# Patient Record
Sex: Female | Born: 1950 | Race: White | Hispanic: No | Marital: Single | Smoking: Former smoker
Health system: Southern US, Community
[De-identification: ages and names within clinical notes are randomized; demographics above are authoritative.]

## PROBLEM LIST (undated history)

## (undated) DIAGNOSIS — Z9289 Personal history of other medical treatment: Secondary | ICD-10-CM

## (undated) DIAGNOSIS — I639 Cerebral infarction, unspecified: Secondary | ICD-10-CM

## (undated) DIAGNOSIS — C801 Malignant (primary) neoplasm, unspecified: Secondary | ICD-10-CM

## (undated) HISTORY — PX: RENAL BIOPSY: SHX156

---

## 2018-04-17 DIAGNOSIS — C541 Malignant neoplasm of endometrium: Secondary | ICD-10-CM

## 2018-04-17 HISTORY — DX: Malignant neoplasm of endometrium: C54.1

## 2020-12-31 DIAGNOSIS — D631 Anemia in chronic kidney disease: Secondary | ICD-10-CM | POA: Diagnosis present

## 2020-12-31 DIAGNOSIS — N186 End stage renal disease: Secondary | ICD-10-CM | POA: Diagnosis present

## 2021-01-16 DIAGNOSIS — I10 Essential (primary) hypertension: Secondary | ICD-10-CM | POA: Insufficient documentation

## 2021-03-04 ENCOUNTER — Encounter (HOSPITAL_COMMUNITY): Payer: Self-pay | Admitting: *Deleted

## 2021-03-04 ENCOUNTER — Emergency Department (HOSPITAL_COMMUNITY)
Admission: EM | Admit: 2021-03-04 | Discharge: 2021-03-04 | Disposition: A | Discharge status: Home / Self Care | Payer: Medicare Other | Attending: Emergency Medicine | Admitting: Emergency Medicine

## 2021-03-04 ENCOUNTER — Emergency Department (HOSPITAL_COMMUNITY): Payer: Medicare Other

## 2021-03-04 ENCOUNTER — Other Ambulatory Visit: Payer: Self-pay

## 2021-03-04 DIAGNOSIS — Z859 Personal history of malignant neoplasm, unspecified: Secondary | ICD-10-CM | POA: Diagnosis not present

## 2021-03-04 DIAGNOSIS — Z992 Dependence on renal dialysis: Secondary | ICD-10-CM | POA: Insufficient documentation

## 2021-03-04 DIAGNOSIS — N189 Chronic kidney disease, unspecified: Secondary | ICD-10-CM

## 2021-03-04 DIAGNOSIS — N186 End stage renal disease: Secondary | ICD-10-CM | POA: Insufficient documentation

## 2021-03-04 DIAGNOSIS — R0602 Shortness of breath: Secondary | ICD-10-CM | POA: Diagnosis present

## 2021-03-04 HISTORY — DX: Personal history of other medical treatment: Z92.89

## 2021-03-04 HISTORY — DX: Cerebral infarction, unspecified: I63.9

## 2021-03-04 HISTORY — DX: Malignant (primary) neoplasm, unspecified: C80.1

## 2021-03-04 LAB — BASIC METABOLIC PANEL
Anion gap: 17 — ABNORMAL HIGH (ref 5–15)
BUN: 64 mg/dL — ABNORMAL HIGH (ref 8–23)
CO2: 24 mmol/L (ref 22–32)
Calcium: 7.8 mg/dL — ABNORMAL LOW (ref 8.9–10.3)
Chloride: 98 mmol/L (ref 98–111)
Creatinine, Ser: 8.66 mg/dL — ABNORMAL HIGH (ref 0.44–1.00)
GFR, Estimated: 4 mL/min — ABNORMAL LOW (ref >60)
Glucose, Bld: 90 mg/dL (ref 70–99)
Potassium: 4.2 mmol/L (ref 3.5–5.1)
Sodium: 139 mmol/L (ref 135–145)

## 2021-03-04 LAB — I-STAT CHEM 8, ED
BUN: 62 mg/dL — ABNORMAL HIGH (ref 8–23)
BUN: 63 mg/dL — ABNORMAL HIGH (ref 8–23)
Calcium, Ion: 0.88 mmol/L — CL (ref 1.15–1.40)
Calcium, Ion: 0.89 mmol/L — CL (ref 1.15–1.40)
Chloride: 99 mmol/L (ref 98–111)
Chloride: 99 mmol/L (ref 98–111)
Creatinine, Ser: 9.2 mg/dL — ABNORMAL HIGH (ref 0.44–1.00)
Creatinine, Ser: 9.3 mg/dL — ABNORMAL HIGH (ref 0.44–1.00)
Glucose, Bld: 94 mg/dL (ref 70–99)
Glucose, Bld: 95 mg/dL (ref 70–99)
HCT: 25 % — ABNORMAL LOW (ref 36.0–46.0)
HCT: 26 % — ABNORMAL LOW (ref 36.0–46.0)
Hemoglobin: 8.5 g/dL — ABNORMAL LOW (ref 12.0–15.0)
Hemoglobin: 8.8 g/dL — ABNORMAL LOW (ref 12.0–15.0)
Potassium: 4.1 mmol/L (ref 3.5–5.1)
Potassium: 4.1 mmol/L (ref 3.5–5.1)
Sodium: 138 mmol/L (ref 135–145)
Sodium: 139 mmol/L (ref 135–145)
TCO2: 25 mmol/L (ref 22–32)
TCO2: 25 mmol/L (ref 22–32)

## 2021-03-04 LAB — CBC WITH DIFFERENTIAL/PLATELET
Abs Immature Granulocytes: 0.01 10*3/uL (ref 0.00–0.07)
Basophils Absolute: 0 10*3/uL (ref 0.0–0.1)
Basophils Relative: 1 %
Eosinophils Absolute: 0.1 10*3/uL (ref 0.0–0.5)
Eosinophils Relative: 2 %
HCT: 26.9 % — ABNORMAL LOW (ref 36.0–46.0)
Hemoglobin: 8.4 g/dL — ABNORMAL LOW (ref 12.0–15.0)
Immature Granulocytes: 0 %
Lymphocytes Relative: 25 %
Lymphs Abs: 1.4 10*3/uL (ref 0.7–4.0)
MCH: 29.1 pg (ref 26.0–34.0)
MCHC: 31.2 g/dL (ref 30.0–36.0)
MCV: 93.1 fL (ref 80.0–100.0)
Monocytes Absolute: 0.4 10*3/uL (ref 0.1–1.0)
Monocytes Relative: 7 %
Neutro Abs: 3.5 10*3/uL (ref 1.7–7.7)
Neutrophils Relative %: 65 %
Platelets: 187 10*3/uL (ref 150–400)
RBC: 2.89 MIL/uL — ABNORMAL LOW (ref 3.87–5.11)
RDW: 16.1 % — ABNORMAL HIGH (ref 11.5–15.5)
WBC: 5.4 10*3/uL (ref 4.0–10.5)
nRBC: 0 % (ref 0.0–0.2)

## 2021-03-04 LAB — BRAIN NATRIURETIC PEPTIDE: B Natriuretic Peptide: 4485.9 pg/mL — ABNORMAL HIGH (ref 0.0–100.0)

## 2021-03-04 MED ORDER — CALCIUM CARBONATE ANTACID 500 MG PO CHEW
1.0000 | CHEWABLE_TABLET | Freq: Once | ORAL | Status: AC
  Administered 2021-03-04: 200 mg via ORAL
  Filled 2021-03-04: qty 1

## 2021-03-04 NOTE — ED Provider Notes (Signed)
MSE was initiated and I personally evaluated the patient and placed orders (if any) at  2:51 PM on March 04, 2021.  The patient appears stable so that the remainder of the MSE may be completed by another provider.  She is presenting by ambulance.  Recently moved here from [place] and has missed 2 dialysis sessions.  Complaining of shortness of breath.  Ordered labs EKG chest x-ray   Hayden Rasmussen, MD 03/04/21 2032

## 2021-03-04 NOTE — ED Provider Notes (Signed)
[facility] EMERGENCY DEPARTMENT Provider Note   CSN: [id number] Arrival date & time: 03/04/21  1446     History Chief Complaint  Patient presents with   Shortness of Breath    Sonya Beck is a 70 y.o. female.  Patient presents ER chief complaint of shortness of breath, generalized malaise.  Patient states that she is a dialysis dependent patient.  Typically gets dialysis Monday Wednesday Friday.  Last episode dialysis was on Friday, subsequently moved down to [place] from [place] on Saturday.  She had called dialysis centers previously and had arranged for dialysis yesterday but they called to the last minute and told her that they were not ready to take her yet.  They were unable to take her today as well, but told that they would be able to dialyze her tomorrow morning at 10 AM.  Patient felt this was too long and came into the ER for evaluation.  She denies any fevers or cough or vomiting or diarrhea.  Denies headache or chest pain.      Past Medical History:  Diagnosis Date   Cancer [facility])    History of blood transfusion    Stroke ([place])     There are no problems to display for this patient.   Past Surgical History:  Procedure Laterality Date   RENAL BIOPSY       OB History   No obstetric history on file.     No family history on file.     Home Medications Prior to Admission medications   Not on File    Allergies    Heparin and Penicillins  Review of Systems   Review of Systems  Constitutional:  Negative for fever.  HENT:  Negative for ear pain.   Eyes:  Negative for pain.  Respiratory:  Negative for cough.   Cardiovascular:  Negative for chest pain.  Gastrointestinal:  Negative for vomiting.  Genitourinary:  Negative for flank pain.  Musculoskeletal:  Negative for back pain.  Skin:  Negative for rash.  Neurological:  Negative for headaches.   Physical Exam Updated Vital Signs BP (!) 114/57   Pulse 72   Temp  98.4 F (36.9 C) (Oral)   Resp (!) 24   Ht 5\' 2"  (1.575 m)   Wt 81.6 kg   SpO2 95%   BMI 32.92 kg/m   Physical Exam Constitutional:      General: She is not in acute distress.    Appearance: Normal appearance.  HENT:     Head: Normocephalic.     Nose: Nose normal.  Eyes:     Extraocular Movements: Extraocular movements intact.  Cardiovascular:     Rate and Rhythm: Normal rate.  Pulmonary:     Effort: Pulmonary effort is normal.  Abdominal:     Tenderness: There is no abdominal tenderness. There is no guarding or rebound.  Musculoskeletal:        General: Normal range of motion.     Cervical back: Normal range of motion.  Neurological:     General: No focal deficit present.     Mental Status: She is alert. Mental status is at baseline.    ED Results / Procedures / Treatments   Labs (all labs ordered are listed, but only abnormal results are displayed) Labs Reviewed  BASIC METABOLIC PANEL - Abnormal; Notable for the following components:      Result Value   BUN 64 (*)    Creatinine, Ser 8.66 (*)  Calcium 7.8 (*)    GFR, Estimated 4 (*)    Anion gap 17 (*)    All other components within normal limits  CBC WITH DIFFERENTIAL/PLATELET - Abnormal; Notable for the following components:   RBC 2.89 (*)    Hemoglobin 8.4 (*)    HCT 26.9 (*)    RDW 16.1 (*)    All other components within normal limits  I-STAT CHEM 8, ED - Abnormal; Notable for the following components:   BUN 62 (*)    Creatinine, Ser 9.20 (*)    Calcium, Ion 0.88 (*)    Hemoglobin 8.5 (*)    HCT 25.0 (*)    All other components within normal limits  I-STAT CHEM 8, ED - Abnormal; Notable for the following components:   BUN 63 (*)    Creatinine, Ser 9.30 (*)    Calcium, Ion 0.89 (*)    Hemoglobin 8.8 (*)    HCT 26.0 (*)    All other components within normal limits  BRAIN NATRIURETIC PEPTIDE    EKG EKG Interpretation  Date/Time:  Wednesday March 04 2021 14:52:34 EDT Ventricular Rate:  77 PR  Interval:  160 QRS Duration: 103 QT Interval:  429 QTC Calculation: 486 R Axis:   81 Text Interpretation: Sinus rhythm Borderline right axis deviation Borderline prolonged QT interval No old tracing to compare Confirmed by Aletta Edouard [phone]) on 03/04/2021 2:53:59 PM  Radiology DG Chest Port 1 View  Result Date: 03/04/2021 CLINICAL DATA:  70 year old female with shortness of breath. EXAM: PORTABLE CHEST 1 VIEW COMPARISON:  None. FINDINGS: There is mild cardiomegaly with mild central vascular congestion. No focal consolidation, pleural effusion, or pneumothorax. Atherosclerotic calcification of the aortic arch. No acute osseous pathology. IMPRESSION: Mild cardiomegaly with mild central vascular congestion. No focal consolidation. Electronically Signed   By: Anner Crete M.D.   On: 03/04/2021 15:21    Procedures Procedures   Medications Ordered in ED Medications  calcium carbonate (TUMS - dosed in mg elemental calcium) chewable tablet 200 mg of elemental calcium (has no administration in time range)    ED Course  I have reviewed the triage vital signs and the nursing notes.  Pertinent labs & imaging results that were available during my care of the patient were reviewed by me and considered in my medical decision making (see chart for details).    MDM Rules/Calculators/A&P                          Vital signs within normal limits patient is 97 to 98% on room air which is appropriate.  Rest.  Brought 2025 which is normal as well.  Chest x-ray shows cardiomegaly but no significant pulmonary edema noted.  Labs otherwise consistent with history of end-stage renal disease normal potassium noted.  Calcium was low and repleted here in the ER.  She states that dialysis will be able to take her tomorrow morning at 10, I feel this is appropriate for her given her vital signs and labs here today.  Recommending she complete her dialysis tomorrow morning as scheduled.  Advised immediate  return if she has pain worsening breathing or any additional concerns.  Final Clinical Impression(s) / ED Diagnoses Final diagnoses:  Chronic kidney disease, unspecified CKD stage    Rx / DC Orders ED Discharge Orders     None        Luna Fuse, MD 03/04/21 1758

## 2021-03-04 NOTE — Discharge Instructions (Addendum)
Call your primary care doctor or specialist as discussed in the next 2-3 days.   Return immediately back to the ER if:  Your symptoms worsen within the next 12-24 hours. You develop new symptoms such as new fevers, persistent vomiting, new pain, shortness of breath, or new weakness or numbness, or if you have any other concerns.  

## 2021-03-18 ENCOUNTER — Encounter (HOSPITAL_COMMUNITY): Payer: Self-pay

## 2021-03-18 ENCOUNTER — Inpatient Hospital Stay (HOSPITAL_COMMUNITY)
Admission: EM | Admit: 2021-03-18 | Discharge: 2021-03-21 | DRG: 291 | Disposition: A | Discharge status: Home Health | Payer: Medicare Other | Attending: Internal Medicine | Admitting: Internal Medicine

## 2021-03-18 ENCOUNTER — Other Ambulatory Visit: Payer: Self-pay

## 2021-03-18 ENCOUNTER — Encounter: Payer: Self-pay | Admitting: Nurse Practitioner

## 2021-03-18 ENCOUNTER — Ambulatory Visit (INDEPENDENT_AMBULATORY_CARE_PROVIDER_SITE_OTHER): Payer: Medicare Other | Admitting: Nurse Practitioner

## 2021-03-18 ENCOUNTER — Emergency Department (HOSPITAL_COMMUNITY): Payer: Medicare Other

## 2021-03-18 ENCOUNTER — Other Ambulatory Visit: Payer: Self-pay | Admitting: Nurse Practitioner

## 2021-03-18 VITALS — BP 105/70 | HR 78 | Temp 98.1°F | Ht 62.0 in | Wt 180.0 lb

## 2021-03-18 DIAGNOSIS — C787 Secondary malignant neoplasm of liver and intrahepatic bile duct: Secondary | ICD-10-CM | POA: Diagnosis present

## 2021-03-18 DIAGNOSIS — C649 Malignant neoplasm of unspecified kidney, except renal pelvis: Secondary | ICD-10-CM

## 2021-03-18 DIAGNOSIS — Z6835 Body mass index (BMI) 35.0-35.9, adult: Secondary | ICD-10-CM

## 2021-03-18 DIAGNOSIS — Z20822 Contact with and (suspected) exposure to covid-19: Secondary | ICD-10-CM | POA: Diagnosis present

## 2021-03-18 DIAGNOSIS — Z992 Dependence on renal dialysis: Secondary | ICD-10-CM | POA: Diagnosis not present

## 2021-03-18 DIAGNOSIS — I350 Nonrheumatic aortic (valve) stenosis: Secondary | ICD-10-CM | POA: Diagnosis not present

## 2021-03-18 DIAGNOSIS — Z87891 Personal history of nicotine dependence: Secondary | ICD-10-CM | POA: Diagnosis not present

## 2021-03-18 DIAGNOSIS — Z86718 Personal history of other venous thrombosis and embolism: Secondary | ICD-10-CM

## 2021-03-18 DIAGNOSIS — Z7689 Persons encountering health services in other specified circumstances: Secondary | ICD-10-CM

## 2021-03-18 DIAGNOSIS — D631 Anemia in chronic kidney disease: Secondary | ICD-10-CM | POA: Diagnosis present

## 2021-03-18 DIAGNOSIS — I5033 Acute on chronic diastolic (congestive) heart failure: Secondary | ICD-10-CM | POA: Diagnosis not present

## 2021-03-18 DIAGNOSIS — Z515 Encounter for palliative care: Secondary | ICD-10-CM | POA: Insufficient documentation

## 2021-03-18 DIAGNOSIS — I083 Combined rheumatic disorders of mitral, aortic and tricuspid valves: Secondary | ICD-10-CM | POA: Diagnosis present

## 2021-03-18 DIAGNOSIS — Z7901 Long term (current) use of anticoagulants: Secondary | ICD-10-CM | POA: Diagnosis not present

## 2021-03-18 DIAGNOSIS — Z880 Allergy status to penicillin: Secondary | ICD-10-CM | POA: Diagnosis not present

## 2021-03-18 DIAGNOSIS — Z888 Allergy status to other drugs, medicaments and biological substances status: Secondary | ICD-10-CM | POA: Diagnosis not present

## 2021-03-18 DIAGNOSIS — M797 Fibromyalgia: Secondary | ICD-10-CM | POA: Diagnosis present

## 2021-03-18 DIAGNOSIS — E785 Hyperlipidemia, unspecified: Secondary | ICD-10-CM | POA: Diagnosis present

## 2021-03-18 DIAGNOSIS — Z8673 Personal history of transient ischemic attack (TIA), and cerebral infarction without residual deficits: Secondary | ICD-10-CM | POA: Diagnosis not present

## 2021-03-18 DIAGNOSIS — I1310 Hypertensive heart and chronic kidney disease without heart failure, with stage 1 through stage 4 chronic kidney disease, or unspecified chronic kidney disease: Secondary | ICD-10-CM | POA: Insufficient documentation

## 2021-03-18 DIAGNOSIS — N186 End stage renal disease: Secondary | ICD-10-CM

## 2021-03-18 DIAGNOSIS — R011 Cardiac murmur, unspecified: Secondary | ICD-10-CM

## 2021-03-18 DIAGNOSIS — Z8542 Personal history of malignant neoplasm of other parts of uterus: Secondary | ICD-10-CM | POA: Diagnosis not present

## 2021-03-18 DIAGNOSIS — I132 Hypertensive heart and chronic kidney disease with heart failure and with stage 5 chronic kidney disease, or end stage renal disease: Secondary | ICD-10-CM | POA: Diagnosis not present

## 2021-03-18 DIAGNOSIS — K219 Gastro-esophageal reflux disease without esophagitis: Secondary | ICD-10-CM | POA: Diagnosis present

## 2021-03-18 DIAGNOSIS — I313 Pericardial effusion (noninflammatory): Secondary | ICD-10-CM | POA: Diagnosis present

## 2021-03-18 DIAGNOSIS — I120 Hypertensive chronic kidney disease with stage 5 chronic kidney disease or end stage renal disease: Secondary | ICD-10-CM

## 2021-03-18 DIAGNOSIS — Z905 Acquired absence of kidney: Secondary | ICD-10-CM

## 2021-03-18 DIAGNOSIS — E8770 Fluid overload, unspecified: Secondary | ICD-10-CM | POA: Diagnosis present

## 2021-03-18 DIAGNOSIS — Z9115 Patient's noncompliance with renal dialysis: Secondary | ICD-10-CM | POA: Diagnosis not present

## 2021-03-18 DIAGNOSIS — Z79899 Other long term (current) drug therapy: Secondary | ICD-10-CM

## 2021-03-18 DIAGNOSIS — E669 Obesity, unspecified: Secondary | ICD-10-CM | POA: Diagnosis present

## 2021-03-18 HISTORY — DX: Personal history of transient ischemic attack (TIA), and cerebral infarction without residual deficits: Z86.73

## 2021-03-18 HISTORY — DX: Gastro-esophageal reflux disease without esophagitis: K21.9

## 2021-03-18 HISTORY — DX: Personal history of other venous thrombosis and embolism: Z86.718

## 2021-03-18 HISTORY — DX: Malignant neoplasm of unspecified kidney, except renal pelvis: C64.9

## 2021-03-18 HISTORY — DX: Nonrheumatic aortic (valve) stenosis: I35.0

## 2021-03-18 HISTORY — DX: Fibromyalgia: M79.7

## 2021-03-18 LAB — CBC WITH DIFFERENTIAL/PLATELET
Abs Immature Granulocytes: 0.02 10*3/uL (ref 0.00–0.07)
Basophils Absolute: 0 10*3/uL (ref 0.0–0.1)
Basophils Relative: 1 %
Eosinophils Absolute: 0.1 10*3/uL (ref 0.0–0.5)
Eosinophils Relative: 2 %
HCT: 27 % — ABNORMAL LOW (ref 36.0–46.0)
Hemoglobin: 8 g/dL — ABNORMAL LOW (ref 12.0–15.0)
Immature Granulocytes: 0 %
Lymphocytes Relative: 24 %
Lymphs Abs: 1.3 10*3/uL (ref 0.7–4.0)
MCH: 28.9 pg (ref 26.0–34.0)
MCHC: 29.6 g/dL — ABNORMAL LOW (ref 30.0–36.0)
MCV: 97.5 fL (ref 80.0–100.0)
Monocytes Absolute: 0.3 10*3/uL (ref 0.1–1.0)
Monocytes Relative: 6 %
Neutro Abs: 3.7 10*3/uL (ref 1.7–7.7)
Neutrophils Relative %: 67 %
Platelets: 216 10*3/uL (ref 150–400)
RBC: 2.77 MIL/uL — ABNORMAL LOW (ref 3.87–5.11)
RDW: 16.4 % — ABNORMAL HIGH (ref 11.5–15.5)
WBC: 5.5 10*3/uL (ref 4.0–10.5)
nRBC: 0 % (ref 0.0–0.2)

## 2021-03-18 LAB — COMPREHENSIVE METABOLIC PANEL
ALT: 12 U/L (ref 0–44)
AST: 14 U/L — ABNORMAL LOW (ref 15–41)
Albumin: 3.3 g/dL — ABNORMAL LOW (ref 3.5–5.0)
Alkaline Phosphatase: 53 U/L (ref 38–126)
Anion gap: 14 (ref 5–15)
BUN: 39 mg/dL — ABNORMAL HIGH (ref 8–23)
CO2: 27 mmol/L (ref 22–32)
Calcium: 8.5 mg/dL — ABNORMAL LOW (ref 8.9–10.3)
Chloride: 97 mmol/L — ABNORMAL LOW (ref 98–111)
Creatinine, Ser: 7.59 mg/dL — ABNORMAL HIGH (ref 0.44–1.00)
GFR, Estimated: 5 mL/min — ABNORMAL LOW (ref >60)
Glucose, Bld: 97 mg/dL (ref 70–99)
Potassium: 3.9 mmol/L (ref 3.5–5.1)
Sodium: 138 mmol/L (ref 135–145)
Total Bilirubin: 0.8 mg/dL (ref 0.3–1.2)
Total Protein: 7 g/dL (ref 6.5–8.1)

## 2021-03-18 MED ORDER — APIXABAN 5 MG PO TABS: 5.0000 mg | ORAL_TABLET | Freq: Two times a day (BID) | ORAL | 3 refills | Status: DC

## 2021-03-18 MED ORDER — POLYETHYLENE GLYCOL 3350 17 G PO PACK: 17.0000 g | PACK | Freq: Every day | ORAL | Status: DC | PRN

## 2021-03-18 MED ORDER — ONDANSETRON HCL 4 MG/2ML IJ SOLN: 4.0000 mg | Freq: Four times a day (QID) | INTRAMUSCULAR | Status: DC | PRN

## 2021-03-18 MED ORDER — PANTOPRAZOLE SODIUM 40 MG PO TBEC
40.0000 mg | DELAYED_RELEASE_TABLET | Freq: Every day | ORAL | Status: DC
  Administered 2021-03-19 – 2021-03-20 (×2): 40 mg via ORAL
  Filled 2021-03-18 (×2): qty 1

## 2021-03-18 MED ORDER — ACETAMINOPHEN 325 MG PO TABS: 650.0000 mg | ORAL_TABLET | Freq: Four times a day (QID) | ORAL | Status: DC | PRN

## 2021-03-18 MED ORDER — APIXABAN 5 MG PO TABS
5.0000 mg | ORAL_TABLET | Freq: Two times a day (BID) | ORAL | Status: DC
  Administered 2021-03-19 – 2021-03-20 (×5): 5 mg via ORAL
  Filled 2021-03-18 (×5): qty 1

## 2021-03-18 MED ORDER — ONDANSETRON HCL 4 MG PO TABS: 4.0000 mg | ORAL_TABLET | Freq: Four times a day (QID) | ORAL | Status: DC | PRN

## 2021-03-18 MED ORDER — PANTOPRAZOLE SODIUM 40 MG PO TBEC: 40.0000 mg | DELAYED_RELEASE_TABLET | Freq: Every day | ORAL | 3 refills | Status: DC

## 2021-03-18 MED ORDER — ACETAMINOPHEN 650 MG RE SUPP: 650.0000 mg | Freq: Four times a day (QID) | RECTAL | Status: DC | PRN

## 2021-03-18 MED ORDER — ACETAMINOPHEN 325 MG PO TABS: 650.0000 mg | ORAL_TABLET | ORAL | Status: DC | PRN

## 2021-03-18 NOTE — ED Triage Notes (Signed)
Pt sent here by PCP, moved here two weeks ago from [place]. Pt usually receives dialysis T/Th/Sat. Last dialysis treatment was Saturday, they will not see her anymore d/t pt is only set up for short term not long term treatment  Pt in NAD, speaking complete sentences, RR even and unlabored. Pt is trying toset up all of her primary care. Her new PCP sent her here for further evaluation.

## 2021-03-18 NOTE — Progress Notes (Signed)
b  New Patient Office Visit  Subjective:  Patient ID: Sonya Beck, female    DOB: 1950-12-22  Age: 70 y.o. MRN: [id number]  CC:  Chief Complaint  Patient presents with   New Patient (Initial Visit)     HPI Sonya Beck presents to establish new primary care provider. She has moved to the area two weeks ago from [place]. She has a complicated medical history. She has been on hemodialysis for many years. She had one kidney completely removed and half of the other due to renal cell carcinoma. When she moved to [place] with her son two weeks ago, she had only a temporary order for hemodialysis at a dialysis center here in [place]. Her last treatment was this past Saturday. Orders for her temporary dialysis only covered her for the first two weeks. She does not have a nephrologist here in [place].  She and her son, who is here in the office with her, believed that the order for temporary hemodialysis was a complete transfer of care from one center to another. They were informed, earlier this week, that was not the case. She has not been able to get her dialysis treatment as a result. When first moving to [place], prior to starting the temporary orders, she did miss a single day of dialysis. It took a few days longer than expected to begin her dialysis treatments here. She did go the the emergency department here. Her serum creatinine was >9.0, her ionized calcium was low. She was moderately anemic. She was not dialyzed in the ED, however, she was set up to have first treatment in [place] the following morning.  The patient has moderate to severe shortness of breath. Prior to her move, she was diagnosed with aortic valve stenosis. She also has history of DVT in left femoral vein. She is taking eliquis 5mg  twice daily. She states that she has been out of this for approximately the last week. She does continue to take furosemide and metolazone. She needs to have cardiologist  in [place] area.  She has had two fights with cancer. She had renal cell carcinoma which resulted In the removal of one kidney and partial removal of other kidney. She is subsequently on hemodialysis. She also had endometrial cancer. She has had a full hysterectomy. Most recently, there were renal cancer cells found on the liver. Her son states that cells were labeled as stage 4 and terminal. She did get single does of immunotherapy prior to her move. She states that this has caused her to have intermittent intestinal cramping and diarrhea. She states that diarrhea has become so bad that she often has incontinent episodes. Has to wear an adult diaper. She states that it is sometimes difficult for her to tell if she is going to have gas or bowel movement and this results in incontinent episode.  The patient denies chest pain. She does have chest pressure and shortness of breath. Shortness of breath is so severe that it is difficult for her to walk ten feet across a room. She has swelling in both lower extremities. She denies headache. She does have some visual decline which has been ongoing issue for past three months.   Past Medical History:  Diagnosis Date   Cancer [facility])    History of blood transfusion    Stroke [facility])     Past Surgical History:  Procedure Laterality Date   RENAL BIOPSY      History reviewed. No pertinent  family history.  Social History   Socioeconomic History   Marital status: Single    Spouse name: Not on file   Number of children: Not on file   Years of education: Not on file   Highest education level: Not on file  Occupational History   Not on file  Tobacco Use   Smoking status: Former    Pack years: 0.00    Types: Cigarettes   Smokeless tobacco: Never  Substance and Sexual Activity   Alcohol use: Not Currently   Drug use: Never   Sexual activity: Not Currently    Birth control/protection: Abstinence  Other Topics Concern   Not on file  Social History  Narrative   Not on file   Social Determinants of Health   Financial Resource Strain: Not on file  Food Insecurity: Not on file  Transportation Needs: Not on file  Physical Activity: Not on file  Stress: Not on file  Social Connections: Not on file  Intimate Partner Violence: Not on file    ROS Review of Systems  Constitutional:  Positive for fatigue. Negative for chills and fever.  HENT:  Negative for congestion, postnasal drip, rhinorrhea, sinus pressure and sinus pain.   Eyes: Negative.   Respiratory:  Positive for chest tightness and shortness of breath. Negative for cough.   Cardiovascular:  Positive for leg swelling. Negative for chest pain and palpitations.       Murmur.   Gastrointestinal:  Positive for abdominal pain and diarrhea. Negative for constipation, nausea and vomiting.       Intermittent intestinal cramping and diarrhea   Endocrine: Negative for cold intolerance, heat intolerance, polydipsia and polyuria.  Musculoskeletal:  Negative for back pain and myalgias.  Skin:  Negative for rash.  Allergic/Immunologic: Negative.   Neurological:  Negative for dizziness, weakness and headaches.  Hematological:  Bruises/bleeds easily.  Psychiatric/Behavioral:  The patient is not nervous/anxious.    Objective:   Today's Vitals   03/18/21 1501  BP: 105/70  Pulse: 78  Temp: 98.1 F (36.7 C)  SpO2: 98%  Weight: 180 lb (81.6 kg)  Height: 5\' 2"  (1.575 m)   Body mass index is 32.92 kg/m.   Physical Exam Vitals and nursing note reviewed.  Constitutional:      Appearance: Normal appearance. She is well-developed. She is obese.  HENT:     Head: Normocephalic and atraumatic.  Eyes:     Pupils: Pupils are equal, round, and reactive to light.  Cardiovascular:     Rate and Rhythm: Normal rate and regular rhythm.     Pulses: Decreased pulses.          Carotid pulses are 1+ on the right side and 1+ on the left side.      Radial pulses are 1+ on the right side and 1+ on  the left side.       Femoral pulses are 1+ on the right side and 1+ on the left side.      Popliteal pulses are 1+ on the right side and 1+ on the left side.       Dorsalis pedis pulses are 1+ on the right side and 1+ on the left side.       Posterior tibial pulses are 1+ on the right side and 1+ on the left side.     Heart sounds: Murmur heard.  Systolic murmur is present with a grade of 4/6.     Comments: Left AV fistula with palpable bruit.  Pulmonary:  Effort: Pulmonary effort is normal.     Breath sounds: Normal breath sounds.  Abdominal:     General: Bowel sounds are normal.     Palpations: Abdomen is soft.  Musculoskeletal:        General: Normal range of motion.     Cervical back: Normal range of motion and neck supple.     Right lower leg: 2+ Pitting Edema present.     Left lower leg: 2+ Pitting Edema present.  Lymphadenopathy:     Cervical: No cervical adenopathy.  Skin:    General: Skin is warm and dry.     Capillary Refill: Capillary refill takes less than 2 seconds.  Neurological:     General: No focal deficit present.     Mental Status: She is alert and oriented to person, place, and time.  Psychiatric:        Mood and Affect: Mood normal.        Behavior: Behavior normal.        Thought Content: Thought content normal.        Judgment: Judgment normal.    Assessment & Plan:  1. Encounter to establish care Appointment today to establish new primary care provider. Will get medical records from providers in [place] for review.   2. 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 Last treatment was this past Saturday. She is now shortn of breath and has 2+ pitting edema in bilateral lower extremities. Needs to have urgent referral to nephrology and dialysis for continued evaluation and treatment. Recommended that patient seek care in ED as she will  likely miss additional hemodialysis treatments.   3. Heart murmur Moderate systolic murmur.   4. Nonrheumatic aortic valve stenosis Patient states she was diagnosed with aortic valve stenosis prior to her move to [facility]. She is having severe shortness of breath and 2+ pitting edema, bilaterally. Recommended she go to ED for immediate evaluation. Will give urgent referral to cardiology for further evaluation.   5. Renal cell carcinoma, unspecified laterality ([place]) Patient was told that she had renal cancer cells found on her liver. Received one treatment with immunotherapy prior to move. Was to have second treatment yesterday. Will get urgent referral to oncology for further evaluation and treatment. Will get records from oncologist in [place] for review.   6. Gastroesophageal reflux disease without esophagitis New prescription for pantoprazole sent to her pharmacy.  - pantoprazole (PROTONIX) 40 MG tablet; Take 1 tablet (40 mg total) by mouth daily.  Dispense: 30 tablet; Refill: 3   7. History of DVT of lower extremity Patient should continue eliquis. New prescription sent to pharmacy  Problem List Items Addressed This Visit       Cardiovascular and Mediastinum   Hypertensive heart and renal disease   Relevant Medications   apixaban (ELIQUIS) 5 MG TABS tablet   furosemide (LASIX) 40 MG tablet   metolazone (ZAROXOLYN) 5 MG tablet   Nonrheumatic aortic valve stenosis   Relevant Medications   apixaban (ELIQUIS) 5 MG TABS tablet   furosemide (LASIX) 40 MG tablet   metolazone (ZAROXOLYN) 5 MG tablet     Digestive   Gastroesophageal reflux disease without esophagitis   Relevant Medications   pantoprazole (PROTONIX) 40 MG tablet     Genitourinary   Renal cell carcinoma (HCC)     Other   Encounter to establish care - Primary  Heart murmur   History of DVT of lower extremity   Relevant Medications   pantoprazole (PROTONIX) 40 MG tablet    Outpatient Encounter  Medications as of 03/18/2021  Medication Sig   apixaban (ELIQUIS) 5 MG TABS tablet Take 1 tablet (5 mg total) by mouth 2 (two) times daily.   furosemide (LASIX) 40 MG tablet Take 40 mg by mouth daily.   metolazone (ZAROXOLYN) 5 MG tablet Take 5 mg by mouth daily.   pantoprazole (PROTONIX) 40 MG tablet Take 1 tablet (40 mg total) by mouth daily.   No facility-administered encounter medications on file as of 03/18/2021.    Follow-up: Return in about 3 weeks (around 04/08/2021) for ckd - note below.   Ronnell Freshwater, NP

## 2021-03-18 NOTE — ED Provider Notes (Signed)
Emergency Medicine Provider Triage Evaluation Note  Sonya Beck , a 70 y.o. female  was evaluated in triage.  Pt complains of she is with dialysis.  She recently moved here.  She had short-term dialysis however has had issues getting set up with dialysis here.  She last had dialysis on Saturday.  She states that she had tried to go Tuesday and they told her that she could not go.  She went to her primary care doctor today who sent her here..  Review of Systems  Positive: Missed dialysis   Negative: fever  Physical Exam  BP (!) 113/51 (BP Location: Right Arm)   Pulse 74   Temp 98.7 F (37.1 C) (Oral)   Resp 18   SpO2 97%  Gen:   Awake, no distress   Resp:  Normal effort  MSK:   Moves extremities without difficulty  Other:  Patient does not have significant obvious respiratory distress.  She is able to answer questions without difficulty.  Medical Decision Making  Medically screening exam initiated at 6:26 PM.  Appropriate orders placed.  Sonya Beck was informed that the remainder of the evaluation will be completed by another provider, this initial triage assessment does not replace that evaluation, and the importance of remaining in the ED until their evaluation is complete.  Note: Portions of this report may have been transcribed using voice recognition software. Every effort was made to ensure accuracy; however, inadvertent computerized transcription errors may be present    Lorin Glass, PA-C 03/18/21 [place], [place], DO 03/18/21 1902

## 2021-03-18 NOTE — H&P (Signed)
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  Patient explains that she has recently moved down to [place] with her son approximately 2 weeks ago from [place] which is where she previously received all of her care.   Over the past 2 weeks, patient has been receiving hemodialysis services at a local dialysis center in [place] however after her Saturday session she was told that she needed to find a permanent dialysis arrangement elsewhere.  Patient has not received dialysis since.  Patient explains that in the past several days to weeks she has experienced increasing abdominal girth and bilateral lower extremity edema.  Patient also complains of shortness of breath, which can be severe in intensity with minimal exertion.  Shortness of breath has been ongoing for approximately 1 year but has worsened as of late.  Patient has been on furosemide and metolazone in the past but is currently not taking these and states that she urinates "less than half a cup" of urine daily.  Patient denies chest pain, fever, recent travel, sick contacts or contacts with confirmed COVID-19 infection.  Due to progressively worsening symptoms of edema and shortness of breath patient eventually presented to [facility] emergency department for evaluation.  Upon evaluation in the emergency  department patient was found to exhibit increased abdominal girth, peripheral edema and evidence of vascular congestion on chest x-ray.  Concern for volume overload superimposed on this patient with known end-stage renal disease and severe aortic stenosis.  Case was discussed with Dr. Johnney Ou with nephrology hospitalization with intent to dialyze the patient tomorrow and working contractural case management to establish a dialysis chair for the patient during this hospitalization.  The hospitalist group was then called to assess the patient for admission to the hospital.  Review of Systems:   Review of Systems  Constitutional:  Positive for malaise/fatigue.  Respiratory:  Positive for shortness of breath.   Cardiovascular:  Positive for leg swelling.  Neurological:  Positive for weakness.  All other systems reviewed and are negative.  Past Medical History:  Diagnosis Date   Cancer [facility])    Endometrial adenocarcinoma ([place]) 04/17/2018   Fibromyalgia 03/18/2021   Gastroesophageal reflux disease without esophagitis 03/18/2021   History of blood transfusion    History of DVT of lower extremity 03/18/2021   History of stroke 03/18/2021   Metastatic renal cell carcinoma to liver ([place]) 03/18/2021   Severe aortic stenosis 03/18/2021   Stroke [facility])     Past Surgical History:  Procedure Laterality Date   RENAL BIOPSY       reports that she has quit smoking. Her smoking use included cigarettes. She has never used smokeless tobacco. She reports previous alcohol use. She reports that she does not use drugs.  Allergies  Allergen Reactions   Heparin    Penicillins     Family History  Problem Relation Age of Onset  Heart disease Neg Hx      Prior to Admission medications   Medication Sig Start Date End Date Taking? Authorizing Provider  apixaban (ELIQUIS) 5 MG TABS tablet Take 1 tablet (5 mg total) by mouth 2 (two) times daily. 03/18/21   Ronnell Freshwater, NP  furosemide (LASIX) 40 MG tablet  Take 40 mg by mouth daily.    [provider]  metolazone (ZAROXOLYN) 5 MG tablet Take 5 mg by mouth daily.    [provider]  pantoprazole (PROTONIX) 40 MG tablet Take 1 tablet (40 mg total) by mouth daily. 03/18/21   Ronnell Freshwater, NP    Physical Exam: Vitals:   03/18/21 1732 03/18/21 2122  BP: (!) 113/51 104/64  Pulse: 74 77  Resp: 18 16  Temp: 98.7 F (37.1 C)   TempSrc: Oral   SpO2: 97% 92%    Constitutional: Awake alert and oriented x3, no associated distress.   Skin: no rashes, no lesions, good skin turgor noted. Eyes: Pupils are equally reactive to light.  No evidence of scleral icterus or conjunctival pallor.  ENMT: Moist mucous membranes noted.  Posterior pharynx clear of any exudate or lesions.   Neck: normal, supple, no masses, no thyromegaly.  No evidence of jugular venous distension.   Respiratory: c mild bibasilar rales.  No evidence of wheezing.  No crackles.  Increased respiratory effort without accessory muscle use.  Cardiovascular: 3 out of 6 systolic murmur noted, crescendo decrescendo in quality.  Regular rate and rhythm, no murmurs / rubs / gallops.  Bilateral lower extremity pitting edema that tracks in the feet to the knees.. 2+ pedal pulses. No carotid bruits.  Chest:   Nontender without crepitus or deformity.   Back:   Nontender without crepitus or deformity. Abdomen: Extremely protuberant abdomen noted that is soft and nontender.  No evidence of intra-abdominal masses.  Positive bowel sounds noted in all quadrants.   Musculoskeletal: No joint deformity upper and lower extremities. Good ROM, no contractures. Normal muscle tone.  Neurologic: CN 2-12 grossly intact. Sensation intact.  Patient moving all 4 extremities spontaneously.  Patient is following all commands.  Patient is responsive to verbal stimuli.   Psychiatric: Patient exhibits depressed mood with appropriate affect.  Patient seems to possess insight as to their current situation.      Labs on Admission: I have personally reviewed following labs and imaging studies -   CBC: Recent Labs  Lab 03/18/21 1824  WBC 5.5  NEUTROABS 3.7  HGB 8.0*  HCT 27.0*  MCV 97.5  PLT 956   Basic Metabolic Panel: Recent Labs  Lab 03/18/21 1824  NA 138  K 3.9  CL 97*  CO2 27  GLUCOSE 97  BUN 39*  CREATININE 7.59*  CALCIUM 8.5*   GFR: Estimated Creatinine Clearance: 6.6 mL/min (A) (by C-G formula based on SCr of 7.59 mg/dL (H)). Liver Function Tests: Recent Labs  Lab 03/18/21 1824  AST 14*  ALT 12  ALKPHOS 53  BILITOT 0.8  PROT 7.0  ALBUMIN 3.3*   No results for input(s): LIPASE, AMYLASE in the last 168 hours. No results for input(s): AMMONIA in the last 168 hours. Coagulation Profile: No results for input(s): INR, PROTIME in the last 168 hours. Cardiac Enzymes: No results for input(s): CKTOTAL, CKMB, CKMBINDEX, TROPONINI in the last 168 hours. BNP (last 3 results) No results for input(s): PROBNP in the last 8760 hours. HbA1C: No results for input(s): HGBA1C in the last 72 hours. CBG: No results  for input(s): GLUCAP in the last 168 hours. Lipid Profile: No results for input(s): CHOL, HDL, LDLCALC, TRIG, CHOLHDL, LDLDIRECT in the last 72 hours. Thyroid Function Tests: No results for input(s): TSH, T4TOTAL, FREET4, T3FREE, THYROIDAB in the last 72 hours. Anemia Panel: No results for input(s): VITAMINB12, FOLATE, FERRITIN, TIBC, IRON, RETICCTPCT in the last 72 hours. Urine analysis: No results found for: COLORURINE, APPEARANCEUR, [place], [place], GLUCOSEU, HGBUR, BILIRUBINUR, KETONESUR, PROTEINUR, UROBILINOGEN, NITRITE, LEUKOCYTESUR  Radiological Exams on Admission - Personally Reviewed: DG Chest 2 View  Result Date: 03/18/2021 CLINICAL DATA:  Missed dialysis EXAM: CHEST - 2 VIEW COMPARISON:  03/04/2021 FINDINGS: Cardiomegaly, vascular congestion. No overt edema. No confluent opacities or effusions. No acute bony abnormality. IMPRESSION: Cardiomegaly,  vascular congestion. Electronically Signed   By: Rolm Baptise M.D.   On: 03/18/2021 19:15    EKG: Personally reviewed.  Rhythm is normal sinus rhythm with heart rate of 75 bpm.  No dynamic ST segment changes appreciated.  Assessment/Plan Principal Problem:   Volume overload  Patient presenting with clinical evidence of volume overload with significant peripheral edema, increased abdominal girth and evidence of vascular congestion on chest x-ray. Patient does complain of severe shortness of breath which is mostly chronic due to her severe aortic stenosis but may be worse than her baseline due to volume overload. Case already discussed with Dr. Johnney Ou by the emergency department provider who plans on evaluating patient in the morning for dialysis.  Patient will also need establishment of a permanent hemodialysis chair prior to discharge. Echocardiogram ordered for the morning Supplemental oxygen as necessary for bouts of hypoxia  Active Problems:   Severe aortic stenosis  Known history of severe aortic stenosis which is contributing to the patient's overall symptoms Echocardiogram ordered in the morning to reassess Patient states that her TAVR was recently canceled by her cardiologist after patient was found to have metastatic lesions to liver with renal cell carcinoma several months ago Will make arrangements for patient to have outpatient follow-up with cardiology here    Gastroesophageal reflux disease without esophagitis  Continue home regimen of PPI    History of DVT of lower extremity  Continue home regimen of Eliquis    Metastatic renal cell carcinoma to liver [facility])  Recently diagnosed in 2018 after a left renal mass was identified Patient is status post partial nephrectomy in 2018 Patient unfortunately was incidentally found to have numerous metastatic lesions to the liver via CT imaging in [place] 10/2020 FNA biopsy of the liver confirmed that these lesions were renal cell  carcinoma in May Patient reports receiving one episode of palliative chemotherapy on 6/9. Will attempt to establish patient with new oncologist here in the area prior to discharge.  Anemia due to end stage renal disease ([place])  None no clinical evidence of bleeding Monitor hemoglobin and hematocrit with serial CBCs.    ESRD on hemodialysis [facility]) Please see notations above concerning discussions with Dr. Johnney Ou and expected nephrology consultation in the morning with dialysis. Patient will need to have permanent hemodialysis chair established during this hospitalization Strict input and output monitoring Renal diet Monitoring renal function electrolytes with serial chemistries.    Code Status:  Full code Family Communication: Deferred   Status is: Inpatient  Remains inpatient appropriate because:Ongoing diagnostic testing needed not appropriate for outpatient work up, IV treatments appropriate due to intensity of illness or inability to take PO, and Inpatient level of care appropriate due to severity of illness  Dispo: The patient is from: Home  Anticipated d/c is to: Home              Patient currently is not medically stable to d/c.   Difficult to place patient No        Vernelle Emerald MD [facility] Pager [phone]  If 7PM-7AM, please contact night-coverage [email or web address] Use universal  password for that web site. If you do not have the password, please call the hospital operator.  03/18/2021, 11:52 PM

## 2021-03-18 NOTE — ED Provider Notes (Signed)
[facility] EMERGENCY DEPARTMENT Provider Note   CSN: [id number] Arrival date & time: 03/18/21  1633     History Chief Complaint  Patient presents with   Shortness of Breath    Sonya Beck is a 70 y.o. female.  70 year old female with prior medical history as detailed below presents for evaluation.  Patient reports that she recently moved to [place] from [place].  She reports that her dialysis sessions were not successfully transferred over from [place] to [place].  Her last dialysis locally was on Saturday.  She reports that she has had gradually worsening lower extremity edema and increasing dyspnea especially with exertion.  She establish new primary care here in [place] today.  She was referred to the ED for further evaluation.  Previously she had established dialysis care with [facility] in [place].  The history is provided by the patient and medical records.  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     No family history on file.  Social History   Tobacco Use   Smoking status: Former    Pack years: 0.00    Types: Cigarettes   Smokeless tobacco: Never   Substance Use Topics   Alcohol use: Not Currently   Drug use: Never    Home Medications Prior to Admission medications   Medication Sig Start Date End Date Taking? Authorizing Provider  apixaban (ELIQUIS) 5 MG TABS tablet Take 1 tablet (5 mg total) by mouth 2 (two) times daily. 03/18/21   Ronnell Freshwater, NP  furosemide (LASIX) 40 MG tablet Take 40 mg by mouth daily.    [provider]  metolazone (ZAROXOLYN) 5 MG tablet Take 5 mg by mouth daily.    [provider]  pantoprazole (PROTONIX) 40 MG tablet Take 1 tablet (40 mg total) by mouth daily. 03/18/21   Ronnell Freshwater, NP    Allergies    Heparin and Penicillins  Review of Systems   Review of Systems  Respiratory:  Positive for shortness of breath.   All other systems reviewed and are negative.  Physical Exam Updated Vital Signs BP 104/64 (BP Location: Right Arm)   Pulse 77   Temp 98.7 F (37.1 C) (Oral)   Resp 16   SpO2 92%   Physical Exam Vitals and nursing note reviewed.  Constitutional:      General: She is not in acute distress.    Appearance: Normal appearance. She is well-developed.  HENT:     Head: Normocephalic and atraumatic.  Eyes:     Conjunctiva/sclera: Conjunctivae normal.     Pupils: Pupils are equal, round, and reactive  to light.  Cardiovascular:     Rate and Rhythm: Normal rate and regular rhythm.     Heart sounds: Normal heart sounds.  Pulmonary:     Effort: Pulmonary effort is normal. No respiratory distress.     Breath sounds: Normal breath sounds.  Abdominal:     General: There is no distension.     Palpations: Abdomen is soft.     Tenderness: There is no abdominal tenderness.  Musculoskeletal:        General: No deformity. Normal range of motion.     Cervical back: Normal range of motion and neck supple.  Skin:    General: Skin is warm and dry.  Neurological:     General: No focal deficit present.     Mental Status: She is alert and oriented to person, place, and  time.    ED Results / Procedures / Treatments   Labs (all labs ordered are listed, but only abnormal results are displayed) Labs Reviewed  COMPREHENSIVE METABOLIC PANEL - Abnormal; Notable for the following components:      Result Value   Chloride 97 (*)    BUN 39 (*)    Creatinine, Ser 7.59 (*)    Calcium 8.5 (*)    Albumin 3.3 (*)    AST 14 (*)    GFR, Estimated 5 (*)    All other components within normal limits  CBC WITH DIFFERENTIAL/PLATELET - Abnormal; Notable for the following components:   RBC 2.77 (*)    Hemoglobin 8.0 (*)    HCT 27.0 (*)    MCHC 29.6 (*)    RDW 16.4 (*)    All other components within normal limits  RESP PANEL BY RT-PCR (FLU A&B, COVID) [id number]    EKG EKG Interpretation  Date/Time:  Wednesday March 18 2021 17:59:30 EDT Ventricular Rate:  75 PR Interval:  164 QRS Duration: 90 QT Interval:  400 QTC Calculation: 446 R Axis:   91 Text Interpretation: Normal sinus rhythm Rightward axis Cannot rule out Anterior infarct , age undetermined Abnormal ECG Confirmed by Dene Gentry [phone]) on 03/18/2021 9:34:18 PM  Radiology DG Chest 2 View  Result Date: 03/18/2021 CLINICAL DATA:  Missed dialysis EXAM: CHEST - 2 VIEW COMPARISON:  03/04/2021 FINDINGS: Cardiomegaly, vascular congestion. No overt edema. No confluent opacities or effusions. No acute bony abnormality. IMPRESSION: Cardiomegaly, vascular congestion. Electronically Signed   By: Rolm Baptise M.D.   On: 03/18/2021 19:15    Procedures Procedures   Medications Ordered in ED Medications - No data to display  ED Course  I have reviewed the triage vital signs and the nursing notes.  Pertinent labs & imaging results that were available during my care of the patient were reviewed by me and considered in my medical decision making (see chart for details).    MDM Rules/Calculators/A&P                          MDM  MSE complete  Sonya Beck was evaluated in Emergency Department on 03/18/2021  for the symptoms described in the history of present illness. She was evaluated in the context of the global COVID-19 pandemic, which necessitated consideration that the patient might be at risk for infection with the SARS-CoV-2 virus that causes COVID-19. Institutional protocols and algorithms that pertain to the evaluation of patients at risk for COVID-19 are in a state of rapid change based on information released by regulatory bodies including the CDC and federal  and state organizations. These policies and algorithms were followed during the patient's care in the ED.  Patient with recent move to [place].  She has not been able to establish local dialysis care  Patient's case discussed with Dr. Johnney Ou of nephrology.  She recommends admission overnight and inpatient dialysis tomorrow with social work evaluation for establishment of local dialysis.  Hospitalist service is aware of case.    Patient understands plan of care.  Final Clinical Impression(s) / ED Diagnoses Final diagnoses:  ESRD (end stage renal disease) ([place])    Rx / DC Orders ED Discharge Orders     None        [name], MD 03/18/21 2213

## 2021-03-19 ENCOUNTER — Inpatient Hospital Stay (HOSPITAL_COMMUNITY): Payer: Medicare Other

## 2021-03-19 DIAGNOSIS — E8770 Fluid overload, unspecified: Secondary | ICD-10-CM

## 2021-03-19 DIAGNOSIS — I350 Nonrheumatic aortic (valve) stenosis: Secondary | ICD-10-CM

## 2021-03-19 LAB — CBC WITH DIFFERENTIAL/PLATELET
Abs Immature Granulocytes: 0.02 10*3/uL (ref 0.00–0.07)
Basophils Absolute: 0 10*3/uL (ref 0.0–0.1)
Basophils Relative: 1 %
Eosinophils Absolute: 0.1 10*3/uL (ref 0.0–0.5)
Eosinophils Relative: 2 %
HCT: 24.2 % — ABNORMAL LOW (ref 36.0–46.0)
Hemoglobin: 7.4 g/dL — ABNORMAL LOW (ref 12.0–15.0)
Immature Granulocytes: 1 %
Lymphocytes Relative: 23 %
Lymphs Abs: 1 10*3/uL (ref 0.7–4.0)
MCH: 29 pg (ref 26.0–34.0)
MCHC: 30.6 g/dL (ref 30.0–36.0)
MCV: 94.9 fL (ref 80.0–100.0)
Monocytes Absolute: 0.3 10*3/uL (ref 0.1–1.0)
Monocytes Relative: 6 %
Neutro Abs: 3 10*3/uL (ref 1.7–7.7)
Neutrophils Relative %: 67 %
Platelets: 186 10*3/uL (ref 150–400)
RBC: 2.55 MIL/uL — ABNORMAL LOW (ref 3.87–5.11)
RDW: 16.4 % — ABNORMAL HIGH (ref 11.5–15.5)
WBC: 4.4 10*3/uL (ref 4.0–10.5)
nRBC: 0 % (ref 0.0–0.2)

## 2021-03-19 LAB — ECHOCARDIOGRAM COMPLETE
AR max vel: 0.82 cm2
AV Area VTI: 0.9 cm2
AV Area mean vel: 0.8 cm2
AV Mean grad: 50 mmHg
AV Peak grad: 85 mmHg
Ao pk vel: 4.61 m/s
Area-P 1/2: 3.42 cm2
Height: 62 in
MV VTI: 2.43 cm2
P 1/2 time: 344 msec
S' Lateral: 4 cm
Single Plane A4C EF: 65.5 %
Weight: 2880 oz

## 2021-03-19 LAB — IRON AND TIBC
Iron: 34 ug/dL (ref 28–170)
Saturation Ratios: 14 % (ref 10.4–31.8)
TIBC: 241 ug/dL — ABNORMAL LOW (ref 250–450)
UIBC: 207 ug/dL

## 2021-03-19 LAB — COMPREHENSIVE METABOLIC PANEL
ALT: 11 U/L (ref 0–44)
AST: 13 U/L — ABNORMAL LOW (ref 15–41)
Albumin: 3.1 g/dL — ABNORMAL LOW (ref 3.5–5.0)
Alkaline Phosphatase: 47 U/L (ref 38–126)
Anion gap: 15 (ref 5–15)
BUN: 41 mg/dL — ABNORMAL HIGH (ref 8–23)
CO2: 25 mmol/L (ref 22–32)
Calcium: 8.4 mg/dL — ABNORMAL LOW (ref 8.9–10.3)
Chloride: 97 mmol/L — ABNORMAL LOW (ref 98–111)
Creatinine, Ser: 7.85 mg/dL — ABNORMAL HIGH (ref 0.44–1.00)
GFR, Estimated: 5 mL/min — ABNORMAL LOW (ref >60)
Glucose, Bld: 92 mg/dL (ref 70–99)
Potassium: 3.7 mmol/L (ref 3.5–5.1)
Sodium: 137 mmol/L (ref 135–145)
Total Bilirubin: 0.9 mg/dL (ref 0.3–1.2)
Total Protein: 6.7 g/dL (ref 6.5–8.1)

## 2021-03-19 LAB — TYPE AND SCREEN
ABO/RH(D): A POS
Antibody Screen: NEGATIVE

## 2021-03-19 LAB — RESP PANEL BY RT-PCR (FLU A&B, COVID) ARPGX2
Influenza A by PCR: NEGATIVE
Influenza B by PCR: NEGATIVE
SARS Coronavirus 2 by RT PCR: NEGATIVE

## 2021-03-19 LAB — FERRITIN: Ferritin: 1215 ng/mL — ABNORMAL HIGH (ref 11–307)

## 2021-03-19 LAB — HEPATITIS B SURFACE ANTIGEN: Hepatitis B Surface Ag: NONREACTIVE

## 2021-03-19 LAB — TSH: TSH: 1.511 u[IU]/mL (ref 0.350–4.500)

## 2021-03-19 LAB — FOLATE: Folate: 10.9 ng/mL (ref >5.9)

## 2021-03-19 LAB — HEPATITIS B CORE ANTIBODY, IGM: Hep B C IgM: NONREACTIVE

## 2021-03-19 LAB — HEPATITIS B SURFACE ANTIBODY,QUALITATIVE: Hep B S Ab: REACTIVE — AB

## 2021-03-19 LAB — VITAMIN B12: Vitamin B-12: 250 pg/mL (ref 180–914)

## 2021-03-19 MED ORDER — SODIUM CHLORIDE 0.9 % IV SOLN: 100.0000 mL | INTRAVENOUS | Status: DC | PRN

## 2021-03-19 MED ORDER — VITAMIN B-12 1000 MCG PO TABS
1000.0000 ug | ORAL_TABLET | Freq: Every day | ORAL | Status: DC
  Administered 2021-03-19 – 2021-03-20 (×2): 1000 ug via ORAL
  Filled 2021-03-19 (×2): qty 1

## 2021-03-19 MED ORDER — LIDOCAINE-PRILOCAINE 2.5-2.5 % EX CREA
1.0000 "application " | TOPICAL_CREAM | CUTANEOUS | Status: DC | PRN
  Filled 2021-03-19: qty 5

## 2021-03-19 MED ORDER — LOPERAMIDE HCL 2 MG PO CAPS: 4.0000 mg | ORAL_CAPSULE | ORAL | Status: DC | PRN

## 2021-03-19 MED ORDER — PENTAFLUOROPROP-TETRAFLUOROETH EX AERO
1.0000 "application " | INHALATION_SPRAY | CUTANEOUS | Status: DC | PRN
  Filled 2021-03-19: qty 116

## 2021-03-19 MED ORDER — LIDOCAINE HCL (PF) 1 % IJ SOLN: 5.0000 mL | INTRAMUSCULAR | Status: DC | PRN

## 2021-03-19 MED ORDER — CHLORHEXIDINE GLUCONATE CLOTH 2 % EX PADS
6.0000 | MEDICATED_PAD | Freq: Every day | CUTANEOUS | Status: DC
  Administered 2021-03-20: 6 via TOPICAL

## 2021-03-19 MED ORDER — ALTEPLASE 2 MG IJ SOLR: 2.0000 mg | Freq: Once | INTRAMUSCULAR | Status: DC | PRN

## 2021-03-19 MED ORDER — SODIUM CHLORIDE 0.9 % IV SOLN
125.0000 mg | INTRAVENOUS | Status: DC
  Filled 2021-03-19: qty 10

## 2021-03-19 NOTE — Discharge Instructions (Signed)

## 2021-03-19 NOTE — Plan of Care (Signed)
°  Problem: Fluid Volume: °Goal: Compliance with measures to maintain balanced fluid volume will improve °Outcome: Progressing °  °Problem: Nutritional: °Goal: Ability to make healthy dietary choices will improve °Outcome: Progressing °  °

## 2021-03-19 NOTE — Consult Note (Signed)
Renal Service Consult Note [facility]  Sonya Beck 03/19/2021 Sol Blazing, MD Requesting Physician: Dr Cyd Silence  Reason for Consult: ESRD pt  HPI: The patient is a 70 y.o. year-old w/ hx of endometrial cancer, FM, hx DVT, CVA, severe AS, hx RCC presented to ED w/ c/o swelling of the abdomen and lower extremities w/ some SOB.  Pt was visiting here as a "transient" getting HD at one of our OP units for 5 visits, which was the initial arrangment while pt was visiting in [place]. After those visits which ended last Saturday, she has not had any dialysis.  Her usual HD center is in [place].  Now pt is staying she is not moving back to [place] so pt is admitted for arranging permanent access to a local HD unit. Aksed to see for dialysis.   Pt w/o and c/o's except as above.     ROS - denies CP, no joint pain, no HA, no blurry vision, no rash, no diarrhea, no nausea/ vomiting, no dysuria, no difficulty voiding   Past Medical History  Past Medical History:  Diagnosis Date   Cancer ([place])    Endometrial adenocarcinoma ([place]) 04/17/2018   Fibromyalgia 03/18/2021   Gastroesophageal reflux disease without esophagitis 03/18/2021   History of blood transfusion    History of DVT of lower extremity 03/18/2021   History of stroke 03/18/2021   Metastatic renal cell carcinoma to liver ([place]) 03/18/2021   Severe aortic stenosis 03/18/2021   Stroke [facility])    Past Surgical History  Past Surgical History:  Procedure Laterality Date   RENAL BIOPSY     Family History  Family History  Problem Relation Age of Onset   Heart disease Neg Hx    Social History  reports that she has quit smoking. Her smoking use included cigarettes. She has never used smokeless tobacco. She reports previous alcohol use. She reports that she does not use drugs. Allergies  Allergies  Allergen Reactions   Heparin Other (See Comments)    HITT   Penicillins Swelling    Swelling in mouth    Home medications Prior to  Admission medications   Medication Sig Start Date End Date Taking? Authorizing Provider  apixaban (ELIQUIS) 5 MG TABS tablet Take 1 tablet (5 mg total) by mouth 2 (two) times daily. 03/18/21  Yes Boscia, Greer Ee, NP  cetirizine (ZYRTEC) 5 MG tablet Take 5 mg by mouth daily as needed for allergies.   Yes [provider]  furosemide (LASIX) 40 MG tablet Take 40 mg by mouth daily.   Yes [provider]  metolazone (ZAROXOLYN) 5 MG tablet Take 5 mg by mouth daily.   Yes [provider]  pantoprazole (PROTONIX) 40 MG tablet Take 1 tablet (40 mg total) by mouth daily. 03/18/21  Yes Ronnell Freshwater, NP     Vitals:   03/19/21 1400 03/19/21 1429 03/19/21 1430 03/19/21 1500  BP: (!) 99/55 (!) 111/57 (!) 111/57 (!) 102/53  Pulse:      Resp: (!) 24  (!) 24 (!) 25  Temp:      TempSrc:      SpO2:      Weight:       Exam Gen: Ill-appearing; NAD No rash, cyanosis or gangrene Sclera anicteric  Chest clear bilat; No wheezing, rales, or rhonchi RRR no MRG  Abd soft ntnd no mass or ascites +bs  Ext No edema, no wounds or ulcers  Neuro is alert, Ox 3   OP  HD: was on HD in [place], now moved to [place] permanently  She previously was a transient patient at [facility] where she received 5 HD treatments. [facility] on 03/14/21 where she received full treatment.  Transient HD Orders at [facility] TTS 3hr/48min EDW 85kg BFR 350 DFR 600 2K/2Ca L AVF   Assessment/ Plan: Volume overload- plan HD today and then reassess. CXR showed vasc congestion , maybe early IS edema. Max UF as tolerated on HD.  ESRD-usual HD was in [place], pt stating she is now moved here permanently. See SW's note, will CLIP to OP HD unit in [place].  H/o severe aortic stenosis Hx DVT - on eliquis at home Metastatic RCC - w/ liver mets, dx'd in 2018 sp partial nephrectomy 2018. By CT in Feb 2022 in [place] she was found to have numerous livers mets, bx showed these were RCC mets. Pt had palliative chemo  on 02/26/21 reportedly.  Anemia ckd - no esa w/ malignancy. Hb 7.4- 8 here, transfuse prn. Anemia panel shows: Iron 34, Tsat 14, and Ferritin 1215-plan to give Ferrlecit 125mg  IV X 4 doses-1st dose to be given with next HD. MBD ckd -check phos, pth, Ca 8.5 uncorr.       Kelly Splinter  MD 03/19/2021, 3:17 PM  Recent Labs  Lab 03/18/21 1824 03/19/21 0520  WBC 5.5 4.4  HGB 8.0* 7.4*   Recent Labs  Lab 03/18/21 1824 03/19/21 0520  K 3.9 3.7  BUN 39* 41*  CREATININE 7.59* 7.85*  CALCIUM 8.5* 8.4*

## 2021-03-19 NOTE — ED Notes (Signed)
Pt alert, NAD, calm, interactive, resps e/u, speaking in clear sentences. Denies pain, sob or nausea. Denies questions or needs. All belongings with pt. Pt going to HD for ~ 3 hrs. May or may not return to ED. Pending room assignment.

## 2021-03-19 NOTE — ED Notes (Signed)
Patient in XRAY at this time.

## 2021-03-19 NOTE — Progress Notes (Signed)
  Echocardiogram 2D Echocardiogram has been performed.  Matilde Bash 03/19/2021, 10:26 AM

## 2021-03-19 NOTE — Progress Notes (Addendum)
NEW ADMISSION NOTE New Admission Note:   Arrival Method: stretcher Mental Orientation: A&O X 3 Telemetry: 33m13 Assessment: Completed Skin: intact. Bruising bilateral arms, scratches bilateral arms and legs. MASD under breast, abdominal folds, groin and buttocks.  IV: Right hand Pain: 0/10 Tubes:  none Safety Measures: Safety Fall Prevention Plan has been given, discussed and signed Admission: Completed [facility] Orientation: Patient has been orientated to the room, unit and staff.  Family: none at bedside  Orders have been reviewed and implemented. Will continue to monitor the patient. Call light has been placed within reach and bed alarm has been activated.   Dona Walby S Kirat Mezquita, RN

## 2021-03-19 NOTE — ED Notes (Signed)
Admitting team at bedside.

## 2021-03-19 NOTE — Progress Notes (Signed)
Renal Navigator read notes that patient needs permanent HD seat in [place]. Patient had been accepted at [facility] as a transient because she reported that she was here visiting and only in need of 5 treatments. This is how she was accepted to the clinic. She is now in the ED stating that she has moved to [place]. Navigator has submitted referral to [facility] Admissions to request outpatient HD seat for ESRD to the closest clinic to her son's home in [place] that has an open seat to accommodate her at this time and will follow closely. Navigator sent message to Dr. Carolin Sicks to update and request HepB labs be drawn as soon as possible, as referral is not complete until HepB labs have been resulted and submitted to Admissions.  Navigator will follow up with patient when she has settled in a hospital room.   Alphonzo Cruise, [place] Renal Navigator [phone]

## 2021-03-19 NOTE — ED Notes (Signed)
Transported to [facility]

## 2021-03-19 NOTE — Plan of Care (Signed)
°  Problem: Coping: °Goal: Level of anxiety will decrease °Outcome: Progressing °  °

## 2021-03-19 NOTE — Progress Notes (Addendum)
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  Patient recently moved to [place] with her son approximately 2 weeks ago from [place].  Over the past 2 weeks she has been receiving hemodialysis services at a local dialysis center in [place], however since Saturday she was told that she needed to find a permanent dialysis arrangement.  Since then she has not received dialysis.     Assessment & Plan:   Principal Problem:   Volume overload Active Problems:   Severe aortic stenosis   Gastroesophageal reflux disease without esophagitis   History of DVT of lower extremity   Metastatic renal cell carcinoma to liver (HCC)   Anemia due to end stage renal disease (HCC)   ESRD on hemodialysis (HCC)   1-Acute on Chronic Diastolic Heart Failure Exacerbation: Volume overload, in the setting of missing hemodialysis: -Patient presents with volume overload, chest x-ray with vascular congestion. -Nephrology has been consulted for hemodialysis. -Echo ordered.   2-Severe Aortic Stenosis: -Echo ordered. -Patient states that her TAVR was recently canceled by her cardiologist after she was found to have metastatic lesion to the liver.  3-GERD: Continue with PPI  History of DVT: Continue with Eliquis  Metastatic renal cell carcinoma to liver: -Diagnosed 2018 after left renal mass was identified -Patient is status post partial nephrectomy 2018 -Patient was found to have numerous metastatic lesions in the liver via CT  [place] 10/2020 -FNA biopsy of the liver confirm these lesions were renal cell carcinoma in May -Patient reports receiving 1 episode of palliative chemotherapy on 6/9  Anemia due to end-stage renal disease: Check anemia panel./  B 12 low , start supplement.   ESRD  on hemodialysis: For HD today.  She will need clip.   Estimated body mass index is 32.92 kg/m as calculated from the following:   Height as of an earlier encounter on 03/18/21: 5\' 2"  (1.575 m).   Weight as of an earlier encounter on 03/18/21: 81.6 kg.   DVT prophylaxis: Eliquis Code Status: Full Code Family Communication: care discussed with patient.  Disposition Plan:  Status is: Inpatient  Remains inpatient appropriate because:Hemodynamically unstable  Dispo: The patient is from: Home              Anticipated d/c is to: Home              Patient currently is not medically stable to d/c.   Difficult to place patient No        Consultants:  Nephrology   Procedures:  ECHO;   Antimicrobials:    Subjective: She report SOB, LE edema  She has a PCP , friend help her get one.   Objective: Vitals:   03/19/21 0615 03/19/21 0630 03/19/21 0645 03/19/21 0700  BP: 115/70 114/62 122/67 131/68  Pulse: 72 75 73 74  Resp:      Temp:      TempSrc:      SpO2: 92% 95% 95% 96%   No intake or output data in the 24 hours ending 03/19/21 0720 There were no vitals filed for this visit.  Examination:  General exam: Appears calm and comfortable  Respiratory system: BL crackles.  Cardiovascular system: S1 & S2 heard, RRR. No JVD, murmurs, rubs, gallops or clicks. No pedal edema. Gastrointestinal system: Abdomen is nondistended, soft and nontender. No organomegaly or masses felt. Normal bowel sounds heard. Central nervous system: Alert and oriented. No focal neurological deficits. Extremities: Symmetric 5 x 5 power.    Data Reviewed: I have personally reviewed following labs and imaging studies  CBC: Recent  Labs  Lab 03/18/21 1824 03/19/21 0520  WBC 5.5 4.4  NEUTROABS 3.7 3.0  HGB 8.0* 7.4*  HCT 27.0* 24.2*  MCV 97.5 94.9  PLT 216 643   Basic Metabolic Panel: Recent Labs  Lab 03/18/21 1824 03/19/21 0520  NA 138 137  K 3.9 3.7  CL 97* 97*  CO2 27 25  GLUCOSE 97 92  BUN 39* 41*  CREATININE 7.59* 7.85*  CALCIUM 8.5* 8.4*   GFR: Estimated Creatinine Clearance: 6.4 mL/min (A) (by C-G formula based on SCr of 7.85 mg/dL (H)). Liver Function Tests: Recent Labs  Lab 03/18/21 1824 03/19/21 0520  AST 14* 13*  ALT 12 11  ALKPHOS 53 47  BILITOT 0.8 0.9  PROT 7.0 6.7  ALBUMIN 3.3* 3.1*   No results for input(s): LIPASE, AMYLASE in the last 168 hours. No results for input(s): AMMONIA in the last 168 hours. Coagulation Profile: No results for input(s): INR, PROTIME in the last 168 hours. Cardiac Enzymes: No results for input(s): CKTOTAL, CKMB, CKMBINDEX, TROPONINI in the last 168 hours. BNP (last 3 results) No results for input(s): PROBNP in the last 8760 hours. HbA1C: No results for input(s): HGBA1C in the last 72 hours. CBG: No results for input(s): GLUCAP in the last 168 hours. Lipid Profile: No results for input(s): CHOL, HDL, LDLCALC, TRIG, CHOLHDL, LDLDIRECT in the last 72 hours. Thyroid Function Tests: Recent Labs    03/19/21 0520  TSH 1.511   Anemia Panel: Recent Labs    03/19/21 0520  VITAMINB12 250  FOLATE 10.9  TIBC 241*  IRON 34   Sepsis Labs: No results for input(s): PROCALCITON, LATICACIDVEN in the last 168 hours.  Recent Results (from the past 240 hour(s))  Resp Panel by RT-PCR (Flu A&B, Covid) Nasopharyngeal Swab     Status: None   Collection Time: 03/18/21  1:23 AM   Specimen: Nasopharyngeal Swab; Nasopharyngeal(NP) swabs in vial transport medium  Result Value Ref Range Status   SARS Coronavirus 2 by RT PCR NEGATIVE NEGATIVE Final    Comment: (NOTE) SARS-CoV-2 target nucleic acids are NOT DETECTED.  The SARS-CoV-2 RNA is generally  detectable in upper respiratory specimens during the acute phase of infection. The lowest concentration of SARS-CoV-2 viral copies this assay can detect is 138 copies/mL. A negative result does not preclude SARS-Cov-2 infection and should not be used as the sole basis for treatment or other patient management decisions. A negative result may occur with  improper specimen collection/handling, submission of specimen other than nasopharyngeal swab, presence of viral mutation(s) within the areas targeted by this assay, and inadequate number of viral copies(<138 copies/mL). A negative result must be combined with clinical observations, patient history, and epidemiological information. The expected result is Negative.  Fact Sheet for Patients:  EntrepreneurPulse.com.au  Fact Sheet for Healthcare Providers:  IncredibleEmployment.be  This test is no t yet approved or cleared by the [place] FDA and  has been authorized for detection and/or diagnosis of SARS-CoV-2 by FDA under an Emergency Use Authorization (EUA). This EUA will remain  in effect (meaning this test can be used) for the duration of the COVID-19 declaration under Section 564(b)(1) of the Act, 21 U.S.C.section 360bbb-3(b)(1), unless the authorization is terminated  or revoked sooner.       Influenza A by PCR NEGATIVE NEGATIVE Final   Influenza B by PCR NEGATIVE NEGATIVE Final    Comment: (NOTE) The Xpert Xpress SARS-CoV-2/FLU/RSV plus assay is intended as an aid in the diagnosis of influenza from Nasopharyngeal swab specimens and should not be used as a sole basis for treatment. Nasal washings and aspirates are unacceptable for Xpert Xpress SARS-CoV-2/FLU/RSV testing.  Fact Sheet for Patients: EntrepreneurPulse.com.au  Fact Sheet for Healthcare Providers: IncredibleEmployment.be  This test is not yet approved or cleared by the [place] FDA  and has been authorized for detection and/or diagnosis of SARS-CoV-2 by FDA under an Emergency Use Authorization (EUA). This EUA will remain in effect (meaning this test can be used) for the duration of the COVID-19 declaration under Section 564(b)(1) of the Act, 21 U.S.C. section 360bbb-3(b)(1), unless the authorization is terminated or revoked.  Performed at [facility], [address], [place], [place] [address]          Radiology Studies: DG Chest 2 View  Result Date: 03/18/2021 CLINICAL DATA:  Missed dialysis EXAM: CHEST - 2 VIEW COMPARISON:  03/04/2021 FINDINGS: Cardiomegaly, vascular congestion. No overt edema. No confluent opacities or effusions. No acute bony abnormality. IMPRESSION: Cardiomegaly, vascular congestion. Electronically Signed   By: Rolm Baptise M.D.   On: 03/18/2021 19:15        Scheduled Meds:  apixaban  5 mg Oral BID   pantoprazole  40 mg Oral Daily   Continuous Infusions:   LOS: 1 day    Time spent: 35 minutes.     Elmarie Shiley, MD [facility]   If 7PM-7AM, please contact night-coverage [email or web address]  03/19/2021, 7:20 AM

## 2021-03-20 ENCOUNTER — Telehealth: Payer: Self-pay | Admitting: Oncology

## 2021-03-20 LAB — RENAL FUNCTION PANEL
Albumin: 3 g/dL — ABNORMAL LOW (ref 3.5–5.0)
Anion gap: 12 (ref 5–15)
BUN: 21 mg/dL (ref 8–23)
CO2: 27 mmol/L (ref 22–32)
Calcium: 8.5 mg/dL — ABNORMAL LOW (ref 8.9–10.3)
Chloride: 98 mmol/L (ref 98–111)
Creatinine, Ser: 5.05 mg/dL — ABNORMAL HIGH (ref 0.44–1.00)
GFR, Estimated: 9 mL/min — ABNORMAL LOW (ref >60)
Glucose, Bld: 139 mg/dL — ABNORMAL HIGH (ref 70–99)
Phosphorus: 4 mg/dL (ref 2.5–4.6)
Potassium: 3.1 mmol/L — ABNORMAL LOW (ref 3.5–5.1)
Sodium: 137 mmol/L (ref 135–145)

## 2021-03-20 LAB — CBC
HCT: 25.2 % — ABNORMAL LOW (ref 36.0–46.0)
Hemoglobin: 7.9 g/dL — ABNORMAL LOW (ref 12.0–15.0)
MCH: 29.3 pg (ref 26.0–34.0)
MCHC: 31.3 g/dL (ref 30.0–36.0)
MCV: 93.3 fL (ref 80.0–100.0)
Platelets: 175 10*3/uL (ref 150–400)
RBC: 2.7 MIL/uL — ABNORMAL LOW (ref 3.87–5.11)
RDW: 16.2 % — ABNORMAL HIGH (ref 11.5–15.5)
WBC: 5 10*3/uL (ref 4.0–10.5)
nRBC: 0 % (ref 0.0–0.2)

## 2021-03-20 LAB — ABO/RH: ABO/RH(D): A POS

## 2021-03-20 MED ORDER — NEPRO/CARBSTEADY PO LIQD
237.0000 mL | ORAL | Status: DC
  Administered 2021-03-20: 237 mL via ORAL

## 2021-03-20 MED ORDER — MIDODRINE HCL 5 MG PO TABS
10.0000 mg | ORAL_TABLET | ORAL | Status: DC
  Administered 2021-03-21: 10 mg via ORAL
  Filled 2021-03-20: qty 2

## 2021-03-20 NOTE — Telephone Encounter (Signed)
I received a new pt referral from Dr. Tyrell Antonio, hospitalist, for dx of metastatic renal cell carcinoma. Sonya Beck has been scheduled to see Dr. Alen Blew on 7/8 at 2pm.

## 2021-03-20 NOTE — Progress Notes (Signed)
[facility] Progress Note  Subjective: seen in room, no c/o today  Vitals:   03/20/21 0300 03/20/21 0404 03/20/21 0515 03/20/21 1003  BP: 90/63 108/63  100/70  Pulse:  76  75  Resp:  18  18  Temp:  98.3 F (36.8 C)  98.1 F (36.7 C)  TempSrc:  Oral  Oral  SpO2:  93%  98%  Weight:   87.3 kg     Exam:  alert, nad   no jvd  Chest cta bilat  Cor reg no RG  Abd soft ntnd no ascites   Ext no LE edema   Alert, NF, ox3   LUA AVF+bruit   OP HD: was on HD in [place], now moved to [place] permanently   She was a transient patient at [facility] where she received 5 HD treatments. Last tx at [facility] on 03/14/21   Transient HD Orders at [facility] -- TTS  3.5h  85kg  350/ 600  2/2 bath  L AVF     Assessment/ Plan: Volume overload- CXR showed vasc congestion. Had HD yest w/ 1.8 L off, SOB better. BP's are on the low side. Cont to lower volume w/ HD tomorrow as tolerated.  BP - bp's low normal, no BP lowering meds here. Will add midodrine 10 mg pre HD.  ESRD- TTS HD. Usual HD was in [place], pt stating she is now moved here permanently. Plan CLIP to OP HD unit in [place]. HD tomorrow.  Metastatic RCC - w/ liver mets, dx'd in 2018 sp partial nephrectomy 2018. In Feb 2022 in [place] she was found to have numerous livers mets by CT, bx showed these were RCC mets. Pt had palliative chemo on 02/26/21 reportedly. Anemia ckd - no esa w/ malignancy. Hb 7.4- 8 here, transfuse prn. Anemia panel shows: Iron 34, Tsat 14, and Ferritin 1215-plan to give Ferrlecit 125mg  IV X 4 doses-1st dose to be given with next HD. MBD ckd -check phos, pth, Ca 8.5 uncorr H/o severe aortic stenosis Hx DVT - on eliquis at home        [place] 03/20/2021, 1:43 PM   Recent Labs  Lab 03/19/21 0520 03/20/21 0432  K 3.7 3.1*  BUN 41* 21  CREATININE 7.85* 5.05*  CALCIUM 8.4* 8.5*  PHOS  --  4.0  HGB 7.4* 7.9*   Inpatient medications:  apixaban  5 mg Oral BID   Chlorhexidine Gluconate Cloth  6 each  Topical Q0600   feeding supplement (NEPRO CARB STEADY)  237 mL Oral Q24H   pantoprazole  40 mg Oral Daily   vitamin B-12  1,000 mcg Oral Daily    [START ON 03/21/2021] ferric gluconate (FERRLECIT) IVPB     acetaminophen **OR** acetaminophen, loperamide, ondansetron **OR** ondansetron (ZOFRAN) IV, polyethylene glycol

## 2021-03-20 NOTE — Evaluation (Signed)
Physical Therapy Evaluation Patient Details Name: Sonya Beck MRN: [id number] DOB: 26-Aug-1951 Today's Date: 03/20/2021   History of Present Illness  Pt is a 70 y/o female presenting to ED on 6/29 with complaints of shortness of breath and edema. CXR shows cardiomegaly, vascular congestion. 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  Clinical Impression  Pt performs bed mobility, transfers, and gait without assistance. Pt tolerates ambulation of household distances with use of RW, limited by fatigue. Pt reports gait speed and distance is decreased compared to her baseline. Pt may benefit from gait training with rollator at next session. Pt demonstrates generalized weakness and decreased activity tolerance and will benefit from acute PT to improve independence in mobility and return to prior level. SPT recommends HHPT to aid in improving activity tolerance and endurance.     Follow Up Recommendations Home health PT    Equipment Recommendations  None recommended by PT    Recommendations for Other Services       Precautions / Restrictions Precautions Precautions: Fall Restrictions Weight Bearing Restrictions: No      Mobility  Bed Mobility Overal bed mobility: Modified Independent             General bed mobility comments: increased time    Transfers Overall transfer level: Modified independent Equipment used: Rolling walker (2 wheeled)                Ambulation/Gait Ambulation/Gait assistance: Supervision Gait Distance (Feet): 140 Feet Assistive device: Rolling walker (2 wheeled) Gait Pattern/deviations: Step-through pattern;Decreased stride length Gait velocity: functional Gait velocity interpretation: 1.31 - 2.62 ft/sec, indicative of limited community ambulator General Gait Details: pt with  slowed step-through gait  Stairs            Wheelchair Mobility    Modified Rankin (Stroke Patients Only)       Balance Overall balance assessment: Needs assistance Sitting-balance support: Feet supported Sitting balance-Leahy Scale: Fair     Standing balance support: During functional activity;Single extremity supported Standing balance-Leahy Scale: Poor Standing balance comment: pt reliant on at least single UE support                             Pertinent Vitals/Pain Pain Assessment: No/denies pain    Home Living Family/patient expects to be discharged to:: Private residence Living Arrangements: Children Available Help at Discharge: Family;Friend(s);Available PRN/intermittently (seems to be near 24/7 available based on pt report) Type of Home: House Home Access: Ramped entrance     Home Layout: One level Home Equipment: Walker - 4 wheels;Shower seat;Cane - single point      Prior Function Level of Independence: Needs assistance   Gait / Transfers Assistance Needed: pt ambulates with use of SPC.  ADL's / Homemaking Assistance Needed: pt requires assistance with cooking, cleaning, and bathing  Comments: Pt reports ambulating in her home and outdoors primarily using [place].     Hand Dominance        Extremity/Trunk Assessment   Upper Extremity Assessment Upper Extremity Assessment: Generalized weakness    Lower Extremity Assessment Lower Extremity Assessment: Generalized weakness    Cervical / Trunk Assessment Cervical / Trunk Assessment: Normal  Communication   Communication: No difficulties  Cognition Arousal/Alertness: Awake/alert Behavior During Therapy: WFL for tasks assessed/performed Overall Cognitive Status: Within Functional Limits for tasks assessed  General Comments General comments (skin integrity, edema, etc.): VSS on RA    Exercises     Assessment/Plan    PT  Assessment Patient needs continued PT services  PT Problem List Decreased strength;Decreased activity tolerance;Decreased balance;Cardiopulmonary status limiting activity       PT Treatment Interventions DME instruction;Gait training;Functional mobility training;Therapeutic activities;Therapeutic exercise;Patient/family education    PT Goals (Current goals can be found in the Care Plan section)  Acute Rehab PT Goals Patient Stated Goal: to improve activity tolerance and return to IADLs PT Goal Formulation: With patient Time For Goal Achievement: 04/03/21 Potential to Achieve Goals: Good Additional Goals Additional Goal #1: Pt will reports 3/10 DOE or less when ambulating for at least 125' to indicate improved tolerance for mobility.    Frequency Min 3X/week   Barriers to discharge        Co-evaluation               AM-PAC PT "6 Clicks" Mobility  Outcome Measure Help needed turning from your back to your side while in a flat bed without using bedrails?: None Help needed moving from lying on your back to sitting on the side of a flat bed without using bedrails?: None Help needed moving to and from a bed to a chair (including a wheelchair)?: None Help needed standing up from a chair using your arms (e.g., wheelchair or bedside chair)?: None Help needed to walk in hospital room?: A Little Help needed climbing 3-5 steps with a railing? : A Little 6 Click Score: 22    End of Session Equipment Utilized During Treatment: Gait belt Activity Tolerance: Patient tolerated treatment well Patient left: in chair;with call bell/phone within reach;with chair alarm set Nurse Communication: Mobility status PT Visit Diagnosis: Other abnormalities of gait and mobility (R26.89);Muscle weakness (generalized) (M62.81)    Time: 2060-1561 PT Time Calculation (min) (ACUTE ONLY): 32 min   Charges:   PT Evaluation $PT Eval Low Complexity: 1 Low          Acute Rehab  Pager:  [phone]   Garwin Brothers, SPT  03/20/2021, 11:53 AM

## 2021-03-20 NOTE — Progress Notes (Signed)
Patient has been accepted as a permanent placement at [facility] HD clinic on a TTS schedule with a seat time of 10:25am. They are able to start her on Tuesday, 03/24/21. She will need to arrive at 10:10am to her appointments. Navigator met with patient and her two sons in the room to inform of above. They are extremely grateful and appreciative. Nephrologist, Attending and Renal PA aware of plan. Renal PA will send clinic new HD orders at discharge.   Alphonzo Cruise, [place] Renal Navigator [phone]

## 2021-03-20 NOTE — Evaluation (Signed)
Occupational Therapy Evaluation/Discharge Patient Details Name: Sonya Beck MRN: [id number] DOB: 01/13/1951 Today's Date: 03/20/2021    History of Present Illness Pt is a 70 y/o female presenting to ED on 6/29 with complaints of shortness of breath and edema. CXR shows cardiomegaly, vascular congestion. 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   Clinical Impression   PTA, pt lives with family and reports Modified Independence with ADLs and mobility (supervision for showering tasks for safety). Pt reports able to complete some IADLs but fatigues quickly. Pt reports feeling better after earlier mobility with PT. Pt able to demo mobility to/from bathroom without AD and without physical assist needed. Pt independent for ADLs standing at sink, as well as toileting task during session. VSS on RA. Reinforced energy conservation strategies, continued progression of activity tolerance in hospital and fall prevention strategies at home. Pt verbalized understanding of all education with no further skilled OT services needed at acute level or on DC.     Follow Up Recommendations  No OT follow up;Supervision - Intermittent    Equipment Recommendations  None recommended by OT (appears well equipped)    Recommendations for Other Services       Precautions / Restrictions Precautions Precautions: Fall Restrictions Weight Bearing Restrictions: No      Mobility Bed Mobility Overal bed mobility: Modified Independent             General bed mobility comments: increased time    Transfers Overall transfer level: Independent Equipment used: None                  Balance Overall balance assessment: Needs assistance Sitting-balance support: Feet supported Sitting balance-Leahy Scale: Good     Standing balance  support: During functional activity;Single extremity supported Standing balance-Leahy Scale: Good Standing balance comment: pt reliant on at least single UE support                           ADL either performed or assessed with clinical judgement   ADL Overall ADL's : At baseline;Modified independent                                       General ADL Comments: able to ambulate to/from bathroom, complete toileting task and grooming standing at sink without AD and without assist     Vision Patient Visual Report: No change from baseline Vision Assessment?: No apparent visual deficits     Perception     Praxis      Pertinent Vitals/Pain Pain Assessment: No/denies pain     Hand Dominance Right   Extremity/Trunk Assessment Upper Extremity Assessment Upper Extremity Assessment: Overall WFL for tasks assessed   Lower Extremity Assessment Lower Extremity Assessment: Defer to PT evaluation   Cervical / Trunk Assessment Cervical / Trunk Assessment: Normal   Communication Communication Communication: No difficulties   Cognition Arousal/Alertness: Awake/alert Behavior During Therapy: WFL for tasks assessed/performed Overall Cognitive Status: Within Functional Limits for tasks assessed                                     General Comments  VSS On RA    Exercises  Shoulder Instructions      Home Living Family/patient expects to be discharged to:: Private residence Living Arrangements: Children Available Help at Discharge: Family;Friend(s);Available PRN/intermittently (near 24/7) Type of Home: House Home Access: Ramped entrance     Home Layout: Multi-level;Able to live on main level with bedroom/bathroom     Bathroom Shower/Tub: Teacher, early years/pre: Standard Bathroom Accessibility: Yes How Accessible: Accessible via wheelchair Home Equipment: [place] - 4 wheels;Shower seat;Cane - single point;Hand held  shower head;Electric scooter          Prior Functioning/Environment Level of Independence: Needs assistance  Gait / Transfers Assistance Needed: reports not always needing DME for mobility but uses cane vs Rollator (to/from dialysis) or electric scooter pending how pt feels ADL's / Homemaking Assistance Needed: Supervision for tub transfers/showering tasks, able to complete some IADLs but fatigues, cares for dogs in the home; does not drive   Comments: Pt reports ambulating in her home and outdoors primarily using SPC.        OT Problem List:        OT Treatment/Interventions:      OT Goals(Current goals can be found in the care plan section) Acute Rehab OT Goals Patient Stated Goal: to improve activity tolerance and return to IADLs OT Goal Formulation: All assessment and education complete, DC therapy  OT Frequency:     Barriers to D/C:            Co-evaluation              AM-PAC OT "6 Clicks" Daily Activity     Outcome Measure Help from another person eating meals?: None Help from another person taking care of personal grooming?: None Help from another person toileting, which includes using toliet, bedpan, or urinal?: None Help from another person bathing (including washing, rinsing, drying)?: None Help from another person to put on and taking off regular upper body clothing?: None Help from another person to put on and taking off regular lower body clothing?: None 6 Click Score: 24   End of Session    Activity Tolerance: Patient tolerated treatment well Patient left: in bed;with call bell/phone within reach  OT Visit Diagnosis: Other abnormalities of gait and mobility (R26.89);Muscle weakness (generalized) (M62.81)                Time: 6295-2841 OT Time Calculation (min): 34 min Charges:  OT General Charges $OT Visit: 1 Visit OT Evaluation $OT Eval Low Complexity: 1 Low OT Treatments $Self Care/Home Management : 8-22 mins  Malachy Chamber, OTR/L Acute Rehab  Services Office: [phone]   Layla Maw 03/20/2021, 12:59 PM

## 2021-03-20 NOTE — Progress Notes (Signed)
Initial Nutrition Assessment  DOCUMENTATION CODES:   Obesity unspecified  INTERVENTION:   -Nepro Shake po daily, each supplement provides 425 kcal and 19 grams protein  NUTRITION DIAGNOSIS:   Increased nutrient needs related to chronic illness (ESRD) as evidenced by estimated needs.  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  Spoke with pt over the phone. Pt reports she typically eats poorly the day of dialysis and following d/t low energy. Today she is feeling better and consumed 100% of her breakfast of eggs, grits, peaches, english muffin and sausage.  Last HD was yesterday 6/30.  Net UF: 1783 ml  Pt is interested in having a supplement. Will order Nepro. She reported having chemotherapy on 6/9 for metastatic renal cell carcinoma.   Per chart review, EDW: 85 kg. Weight is slightly elevated at 87.3 kg.  Medications: Vitamin B-12   Labs reviewed:  Low K Corrected calcium 8.9  NUTRITION - FOCUSED PHYSICAL EXAM:  Unable to complete  Diet Order:   Diet Order             Diet renal with fluid restriction Fluid restriction: 1200 mL Fluid; Room service appropriate? Yes; Fluid consistency: Thin  Diet effective now                   EDUCATION NEEDS:      Skin:  Skin Assessment: Reviewed RN Assessment  Last BM:  6/29  Height:   Ht Readings from Last 1 Encounters:  03/18/21 5\' 2"   (1.575 m)    Weight:   Wt Readings from Last 1 Encounters:  03/20/21 87.3 kg    BMI:  Body mass index is 35.2 kg/m.  Estimated Nutritional Needs:   Kcal:  2100-2300  Protein:  65-75g  Fluid:  1L + UOP  Clayton Bibles, MS, RD, LDN Inpatient Clinical Dietitian Contact information available via Amion

## 2021-03-20 NOTE — Progress Notes (Signed)
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  Patient recently moved to [place] with her son approximately 2 weeks ago from [place].  Over the past 2 weeks she has been receiving hemodialysis services at a local dialysis center in [place], however since Saturday she was told that she needed to find a permanent dialysis arrangement.  Since then she has not received dialysis.     Assessment & Plan:   Principal Problem:   Volume overload Active Problems:   Severe aortic stenosis   Gastroesophageal reflux disease without esophagitis   History of DVT of lower extremity   Metastatic renal cell carcinoma to liver (HCC)   Anemia due to end stage renal disease (HCC)   ESRD on hemodialysis (HCC)   1-Acute on Chronic Diastolic Heart Failure Exacerbation: Volume overload, in the setting of missing hemodialysis: -Patient presents with volume overload, chest x-ray with vascular congestion. -Nephrology has been consulted for hemodialysis. -ECHO; severe aortic stenosis.  -underwent HD 6/29.Marland Kitchen   2-Severe Aortic Stenosis: -Echo ordered. -Patient states that her TAVR was recently canceled by her cardiologist after she was found to have metastatic lesion to the liver. -Cardiology consulted. Patient looking for second opinion.   3-GERD: Continue with PPI  History of DVT: Continue with Eliquis  Metastatic renal cell carcinoma to liver: -Diagnosed 2018 after left renal mass was identified -Patient is  status post partial nephrectomy 2018 -Patient was found to have numerous metastatic lesions in the liver via CT [place] 10/2020 -FNA biopsy of the liver confirm these lesions were renal cell carcinoma in May -Patient reports receiving 1 episode of palliative chemotherapy on 6/9 -I have made arrangement for Follow up with Dr Alen Blew at [facility] center July 8 at 2 PM/   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  Getting IV iron.   ESRD  on hemodialysis: For HD today.  She will need clip.   Estimated body mass index is 35.2 kg/m as calculated from the following:   Height as of an earlier encounter on 03/18/21: 5\' 2"  (1.575 m).   Weight as of this encounter: 87.3 kg.   DVT prophylaxis: Eliquis Code Status: Full Code Family Communication: care discussed with patient.  Disposition Plan:  Status is: Inpatient  Remains inpatient appropriate because:Hemodynamically unstable  Dispo: The patient is from: Home              Anticipated d/c is to: Home              Patient currently is not medically stable to d/c.   Difficult to place patient No        Consultants:  Nephrology   Procedures:  ECHO;   Antimicrobials:    Subjective: Feeling better, denies dyspnea.   Objective: Vitals:   03/20/21 0026 03/20/21 0300 03/20/21 0404 03/20/21 0515  BP: (!) 90/59 90/63 108/63   Pulse:   76   Resp:   18   Temp:   98.3 F (36.8 C)   TempSrc:   Oral   SpO2:   93%  Weight:    87.3 kg    Intake/Output Summary (Last 24 hours) at 03/20/2021 0813 Last data filed at 03/20/2021 0053 Gross per 24 hour  Intake 500 ml  Output 1784 ml  Net -1284 ml   Filed Weights   03/19/21 1159 03/20/21 0515  Weight: 81.8 kg 87.3 kg    Examination:  General exam: NAD Respiratory system: BL Crackles.  Cardiovascular system: S 1, S 2 RRR Gastrointestinal system: BS present, soft, nt  Central nervous system: alert, conversant Extremities: Symmetric power    Data  Reviewed: I have personally reviewed following labs and imaging studies  CBC: Recent Labs  Lab 03/18/21 1824 03/19/21 0520 03/20/21 0432  WBC 5.5 4.4 5.0  NEUTROABS 3.7 3.0  --   HGB 8.0* 7.4* 7.9*  HCT 27.0* 24.2* 25.2*  MCV 97.5 94.9 93.3  PLT 216 186 726    Basic Metabolic Panel: Recent Labs  Lab 03/18/21 1824 03/19/21 0520 03/20/21 0432  NA 138 137 137  K 3.9 3.7 3.1*  CL 97* 97* 98  CO2 27 25 27   GLUCOSE 97 92 139*  BUN 39* 41* 21  CREATININE 7.59* 7.85* 5.05*  CALCIUM 8.5* 8.4* 8.5*  PHOS  --   --  4.0    GFR: Estimated Creatinine Clearance: 10.3 mL/min (A) (by C-G formula based on SCr of 5.05 mg/dL (H)). Liver Function Tests: Recent Labs  Lab 03/18/21 1824 03/19/21 0520 03/20/21 0432  AST 14* 13*  --   ALT 12 11  --   ALKPHOS 53 47  --   BILITOT 0.8 0.9  --   PROT 7.0 6.7  --   ALBUMIN 3.3* 3.1* 3.0*    No results for input(s): LIPASE, AMYLASE in the last 168 hours. No results for input(s): AMMONIA in the last 168 hours. Coagulation Profile: No results for input(s): INR, PROTIME in the last 168 hours. Cardiac Enzymes: No results for input(s): CKTOTAL, CKMB, CKMBINDEX, TROPONINI in the last 168 hours. BNP (last 3 results) No results for input(s): PROBNP in the last 8760 hours. HbA1C: No results for input(s): HGBA1C in the last 72 hours. CBG: No results for input(s): GLUCAP in the last 168 hours. Lipid Profile: No results for input(s): CHOL, HDL, LDLCALC, TRIG, CHOLHDL, LDLDIRECT in the last 72 hours. Thyroid Function Tests: Recent Labs    03/19/21 0520  TSH 1.511    Anemia Panel: Recent Labs    03/19/21 0520 03/19/21 1339  VITAMINB12 250  --   FOLATE 10.9  --   FERRITIN  --  1,215*  TIBC 241*  --   IRON 34  --     Sepsis Labs: No results for input(s): PROCALCITON, LATICACIDVEN in the last 168 hours.  Recent Results (from the past 240 hour(s))  Resp Panel by RT-PCR (Flu A&B, Covid) Nasopharyngeal Swab     Status: None    Collection Time: 03/18/21  1:23 AM   Specimen: Nasopharyngeal Swab; Nasopharyngeal(NP) swabs in vial transport medium  Result Value Ref Range Status   SARS Coronavirus 2 by RT PCR NEGATIVE NEGATIVE Final    Comment: (NOTE) SARS-CoV-2 target nucleic acids are NOT DETECTED.  The SARS-CoV-2 RNA is generally detectable in upper respiratory specimens during the acute phase of infection. The lowest concentration of SARS-CoV-2 viral copies this assay can detect is 138 copies/mL. A negative result does not preclude SARS-Cov-2 infection and should not be used as the sole basis for treatment or other patient management decisions. A negative result may occur with  improper specimen collection/handling, submission of specimen other than nasopharyngeal swab, presence of viral mutation(s) within the areas targeted by this assay, and inadequate number of viral copies(<138 copies/mL). A negative result must be combined with clinical observations, patient history, and epidemiological information. The expected result is Negative.  Fact Sheet for Patients:  EntrepreneurPulse.com.au  Fact Sheet for Healthcare Providers:  IncredibleEmployment.be  This test is no t yet approved or cleared by the [place] FDA and  has been authorized for detection and/or diagnosis of SARS-CoV-2 by FDA under an Emergency Use Authorization (EUA). This EUA will remain  in effect (meaning this test can be used) for the duration of the COVID-19 declaration under Section 564(b)(1) of the Act, 21 U.S.C.section 360bbb-3(b)(1), unless the authorization is terminated  or revoked sooner.       Influenza A by PCR NEGATIVE NEGATIVE Final   Influenza B by PCR NEGATIVE NEGATIVE Final    Comment: (NOTE) The Xpert Xpress SARS-CoV-2/FLU/RSV plus assay is intended as an aid in the diagnosis of influenza from Nasopharyngeal swab specimens and should not be used as a sole basis for treatment.  Nasal washings and aspirates are unacceptable for Xpert Xpress SARS-CoV-2/FLU/RSV testing.  Fact Sheet for Patients: EntrepreneurPulse.com.au  Fact Sheet for Healthcare Providers: IncredibleEmployment.be  This test is not yet approved or cleared by the [place] FDA and has been authorized for detection and/or diagnosis of SARS-CoV-2 by FDA under an Emergency Use Authorization (EUA). This EUA will remain in effect (meaning this test can be used) for the duration of the COVID-19 declaration under Section 564(b)(1) of the Act, 21 U.S.C. section 360bbb-3(b)(1), unless the authorization is terminated or revoked.  Performed at [facility], [place] [address] [place], [place] [address]           Radiology Studies: DG Chest 2 View  Result Date: 03/18/2021 CLINICAL DATA:  Missed dialysis EXAM: CHEST - 2 VIEW COMPARISON:  03/04/2021 FINDINGS: Cardiomegaly, vascular congestion. No overt edema. No confluent opacities or effusions. No acute bony abnormality. IMPRESSION: Cardiomegaly, vascular congestion. Electronically Signed   By: Rolm Baptise M.D.   On: 03/18/2021 19:15   ECHOCARDIOGRAM COMPLETE  Result Date: 03/19/2021    ECHOCARDIOGRAM REPORT   Patient Name:   Sonya Beck Date of Exam: 03/19/2021 Medical Rec #:  [id number]        Height:       62.0 in Accession #:    [id number]       Weight:       180.0 lb Date of Birth:  [birth year]        BSA:          1.828 m Patient Age:    61 years         BP:           117/67 mmHg Patient Gender: F                HR:           75 bpm. Exam Location:  Inpatient Procedure: 2D Echo, Cardiac Doppler and Color Doppler Indications:    Aortic stenosis  History:        Patient has no prior history of Echocardiogram examinations.                 Stroke, Aortic Valve Disease and severe aortic stenosis,                 Signs/Symptoms:Shortness of Breath and ESRD; Risk  Factors:Dyslipidemia and Obesity.   Sonographer:    Dustin Flock Referring Phys: [id number] [place]  1. The aortic valve is tricuspid. There is severe calcifcation of the aortic valve. There is severe thickening of the aortic valve. Aortic valve regurgitation is mild. Severe aortic valve stenosis. Aortic valve area, by VTI measures 0.90 cm. Aortic valve mean gradient measures 50.0 mmHg. Aortic valve Vmax measures 4.61 m/s.  2. The mitral valve is degenerative. Moderate to severe mitral valve regurgitation. No evidence of mitral stenosis. Moderate to severe mitral annular calcification.  3. Left ventricular ejection fraction, by estimation, is 55 to 60%. The left ventricle has normal function. The left ventricle has no regional wall motion abnormalities. There is moderate concentric left ventricular hypertrophy. Left ventricular diastolic function could not be evaluated.  4. Right ventricular systolic function is mildly reduced. The right ventricular size is mildly enlarged. There is moderately elevated pulmonary artery systolic pressure. The estimated right ventricular systolic pressure is 46.9 mmHg.  5. Left atrial size was mild to moderately dilated.  6. Right atrial size was mildly dilated.  7. The inferior vena cava is dilated in size with <50% respiratory variability, suggesting right atrial pressure of 15 mmHg.  8. A small pericardial effusion is present. The pericardial effusion is circumferential. There is no evidence of cardiac tamponade. FINDINGS  Left Ventricle: Left ventricular ejection fraction, by estimation, is 55 to 60%. The left ventricle has normal function. The left ventricle has no regional wall motion abnormalities. The left ventricular internal cavity size was normal in size. There is  moderate concentric left ventricular hypertrophy. Left ventricular diastolic function could not be evaluated due to mitral annular calcification (moderate or greater). Left ventricular diastolic function could not be  evaluated. Right Ventricle: The right ventricular size is mildly enlarged. No increase in right ventricular wall thickness. Right ventricular systolic function is mildly reduced. There is moderately elevated pulmonary artery systolic pressure. The tricuspid regurgitant velocity is 3.06 m/s, and with an assumed right atrial pressure of 15 mmHg, the estimated right ventricular systolic pressure is 62.9 mmHg. Left Atrium: Left atrial size was mild to moderately dilated. Right Atrium: Right atrial size was mildly dilated. Pericardium: A small pericardial effusion is present. The pericardial effusion is circumferential. There is no evidence of cardiac tamponade. Mitral Valve: The mitral valve is degenerative in appearance. Moderate to severe mitral annular calcification. Moderate to severe mitral valve regurgitation. No evidence of mitral valve stenosis. MV peak gradient, 13.1 mmHg. The mean mitral valve gradient is 5.0 mmHg. Tricuspid Valve: The tricuspid valve is grossly normal. Tricuspid valve regurgitation is mild . No evidence of tricuspid stenosis. Aortic Valve: The aortic valve is tricuspid. There is severe calcifcation of the aortic valve. There is severe thickening of the aortic valve. Aortic valve regurgitation is mild. Aortic regurgitation PHT measures 344 msec. Severe aortic stenosis is present. Aortic valve mean gradient measures 50.0 mmHg. Aortic valve peak gradient measures 85.0 mmHg. Aortic valve area, by VTI measures 0.90 cm. Pulmonic Valve: The pulmonic valve was grossly normal. Pulmonic valve regurgitation is mild. No evidence of pulmonic stenosis. Aorta: The aortic root and ascending aorta are structurally normal, with no evidence of dilitation. Venous: The inferior vena cava is dilated in size with less than 50% respiratory variability, suggesting right atrial pressure of 15 mmHg. IAS/Shunts: The atrial septum is grossly normal.  LEFT VENTRICLE PLAX 2D LVIDd:         5.20 cm      Diastology LVIDs:  4.00 cm      LV e' medial:    4.79 cm/s LV PW:         1.60 cm      LV E/e' medial:  26.5 LV IVS:        1.70 cm      LV e' lateral:   6.42 cm/s LVOT diam:     2.20 cm      LV E/e' lateral: 19.8 LV SV:         95 LV SV Index:   52 LVOT Area:     3.80 cm  LV Volumes (MOD) LV vol d, MOD A4C: 195.0 ml LV vol s, MOD A4C: 67.2 ml LV SV MOD A4C:     195.0 ml RIGHT VENTRICLE RV Basal diam:  4.90 cm RV S prime:     8.05 cm/s TAPSE (M-mode): 1.9 cm LEFT ATRIUM             Index       RIGHT ATRIUM           Index LA diam:        4.80 cm 2.63 cm/m  RA Area:     20.50 cm LA Vol (A2C):   79.3 ml 43.38 ml/m RA Volume:   66.20 ml  36.22 ml/m LA Vol (A4C):   70.1 ml 38.35 ml/m LA Biplane Vol: 79.0 ml 43.22 ml/m  AORTIC VALVE AV Area (Vmax):    0.82 cm AV Area (Vmean):   0.80 cm AV Area (VTI):     0.90 cm AV Vmax:           461.00 cm/s AV Vmean:          331.000 cm/s AV VTI:            1.060 m AV Peak Grad:      85.0 mmHg AV Mean Grad:      50.0 mmHg LVOT Vmax:         99.20 cm/s LVOT Vmean:        70.000 cm/s LVOT VTI:          0.251 m LVOT/AV VTI ratio: 0.24 AI PHT:            344 msec  AORTA Ao Root diam: 2.90 cm MITRAL VALVE                TRICUSPID VALVE MV Area (PHT): 3.42 cm     TR Peak grad:   37.5 mmHg MV Area VTI:   2.43 cm     TR Vmax:        306.00 cm/s MV Peak grad:  13.1 mmHg MV Mean grad:  5.0 mmHg     SHUNTS MV Vmax:       1.81 m/s     Systemic VTI:  0.25 m MV Vmean:      99.0 cm/s    Systemic Diam: 2.20 cm MV Decel Time: 222 msec MV E velocity: 127.00 cm/s MV A velocity: 64.50 cm/s MV E/A ratio:  1.97 Eleonore Chiquito MD Electronically signed by Eleonore Chiquito MD Signature Date/Time: 03/19/2021/1:46:03 PM    Final         Scheduled Meds:  apixaban  5 mg Oral BID   Chlorhexidine Gluconate Cloth  6 each Topical Q0600   pantoprazole  40 mg Oral Daily   vitamin B-12  1,000 mcg Oral Daily   Continuous Infusions:  [START ON 03/21/2021] ferric gluconate (FERRLECIT) IVPB  LOS: 2 days     Time spent: 35 minutes.     Elmarie Shiley, MD [facility]   If 7PM-7AM, please contact night-coverage [email or web address]  03/20/2021, 8:13 AM

## 2021-03-20 NOTE — Progress Notes (Signed)
All records have been faxed to [facility] to complete outpatient HD clinic referral.  Navigator will follow.  Alphonzo Cruise, [place] [facility]al Navigator [phone]

## 2021-03-20 NOTE — Consult Note (Addendum)
[place]  Cardiology Consultation:   Patient ID: Sonya Beck MRN: [id number]; DOB: 1950/10/26  Admit date: 03/18/2021 Date of Consult: 03/20/2021  Primary Care Provider: Ronnell Freshwater, NP [facility] Cardiologist: New- recently moved from [place].  [facility] Electrophysiologist:  None    Patient Profile:   Sonya Beck is a 70 y.o. 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 Tyrell Antonio.  History of Present Illness:   [name] recently relocated to [facility] from [place] to live with her son.  The patient does not drive and walks with the aid of a walker or cane.  She is not very active mostly due to significant dyspnea on exertion and leg weakness.  She also has had significant side effects from recent immunotherapy she was treated with.  Of note, the patient has all of her natural teeth however several are missing.  She has not seen the dentist and several years due to inability to afford it.  She has no dental pain at this time.  The patient was diagnosed with renal cell carcinoma in 2018 and underwent partial nephrectomy.  She later required a renal biopsy which ultimately caused a massive bleed and hypovolemic shock.  During that admission she had several contrast dye studies which caused progressive renal dysfunction and ended up on hemodialysis.  She has been receiving dialysis for 2 years now.  Patient was followed by a cardiologist in [place] and told me she was diagnosed with severe aortic stenosis in January of this year.  They were  considering her for TAVR, however, she was ultimately turned down given a more recent diagnosis of renal cell carcinoma metastatic to the liver.  She was told by her oncologist in [place] that "she would die," but was never given a specific timeline.  She was offered palliative immunotherapy injections which she did start on June 6 of this year.  This caused her to feel nauseated, weak and generally unwell.  She never got her second injection due to moving to [place].  This was originally supposed to be only for 2 weeks, however she decided to stay permanently last minute.  She was receiving hemodialysis on Tuesday Thursdays and Saturdays at [facility].  However, this was only supposed to be temporary and the dialysis center told her they could no longer treat her due to lack of bed availability.  She missed 2 hemodialysis sessions and then ended up being admitted to [facility] for acute volume overload.  This has improved with resumption of hemodialysis here at [facility].  Given history of severe aortic stenosis and echocardiogram was ordered.  This showed an EF of 55 to 60%, mild RV dysfunction/enlargement, moderately elevated pulmonary artery pressures with an RVSP of 52.5 mmHg.  There was severe aortic stenosis with a mean gradient of 50 mmHg, peak gradient of 85 mmHg, AVA of 0.82 cm, DVI 0.24.  There is a small circumferential pericardial effusion without evidence of cardiac tamponade.  The patient requested a second opinion for TAVR at [facility] and the structural  heart team is consulted.  She is seen today laying in bed.  She says that she is extremely limited in her activities of daily life due to dyspnea on exertion, weakness and fatigue.  She cannot even cook or do laundry due to these symptoms. She wishes for her heart valve to be replaced so that she can live out the rest of her days being able to breathe.  She denies chest pain, dizziness or syncope.   She does have intermittent lower extremity edema, which is relatively new.  This does improve with dialysis.  She occasionally has orthopnea but no PND.  Past Medical History:  Diagnosis Date   Cancer [facility])    Endometrial adenocarcinoma ([place]) 04/17/2018   Fibromyalgia 03/18/2021   Gastroesophageal reflux disease without esophagitis 03/18/2021   History of blood transfusion    History of DVT of lower extremity 03/18/2021   History of stroke 03/18/2021   Metastatic renal cell carcinoma to liver ([place]) 03/18/2021   Severe aortic stenosis 03/18/2021   Stroke [facility])     Past Surgical History:  Procedure Laterality Date   RENAL BIOPSY       Home Medications:  Prior to Admission medications   Medication Sig Start Date End Date Taking? Authorizing Provider  apixaban (ELIQUIS) 5 MG TABS tablet Take 1 tablet (5 mg total) by mouth 2 (two) times daily. 03/18/21  Yes Boscia, Greer Ee, NP  cetirizine (ZYRTEC) 5 MG tablet Take 5 mg by mouth daily as needed for allergies.   Yes [provider]  furosemide (LASIX) 40 MG tablet Take 40 mg by mouth daily.   Yes [provider]  metolazone (ZAROXOLYN) 5 MG tablet Take 5 mg by mouth daily.   Yes [provider]  pantoprazole (PROTONIX) 40 MG tablet Take 1 tablet (40 mg total) by mouth daily. 03/18/21  Yes Ronnell Freshwater, NP    Inpatient Medications: Scheduled Meds:  apixaban  5 mg Oral BID   Chlorhexidine Gluconate Cloth  6 each Topical Q0600   pantoprazole  40 mg Oral Daily   vitamin B-12  1,000 mcg Oral Daily   Continuous Infusions:  [START ON 03/21/2021] ferric gluconate (FERRLECIT) IVPB     PRN Meds: acetaminophen **OR** acetaminophen, loperamide, ondansetron **OR** ondansetron (ZOFRAN) IV, polyethylene glycol  Allergies:    Allergies  Allergen Reactions   Heparin Other (See Comments)    HITT   Penicillins Swelling    Swelling in mouth     Social History:   Social History   Socioeconomic History   Marital  status: Single    Spouse name: Not on file   Number of children: Not on file   Years of education: Not on file   Highest education level: Not on file  Occupational History   Not on file  Tobacco Use   Smoking status: Former    Pack years: 0.00    Types: Cigarettes   Smokeless tobacco: Never  Vaping Use   Vaping Use: Never used  Substance and Sexual Activity   Alcohol use: Not Currently   Drug use: Never   Sexual activity: Not Currently    Birth control/protection: Abstinence  Other Topics Concern   Not on file  Social History Narrative   Not on file   Social Determinants of Health   Financial Resource Strain: Not on file  Food Insecurity: Not on file  Transportation Needs: Not on file  Physical Activity: Not on file  Stress: Not on file  Social Connections:  Not on file  Intimate Partner Violence: Not on file    Family History:   Family History  Problem Relation Age of Onset   Heart disease Neg Hx      ROS:  Please see the history of present illness.  All other ROS reviewed and negative.     Physical Exam/Data:   Vitals:   03/20/21 0300 03/20/21 0404 03/20/21 0515 03/20/21 1003  BP: 90/63 108/63  100/70  Pulse:  76  75  Resp:  18  18  Temp:  98.3 F (36.8 C)  98.1 F (36.7 C)  TempSrc:  Oral  Oral  SpO2:  93%  98%  Weight:   87.3 kg     Intake/Output Summary (Last 24 hours) at 03/20/2021 1052 Last data filed at 03/20/2021 0800 Gross per 24 hour  Intake 800 ml  Output 1784 ml  Net -984 ml   Last 3 Weights 03/20/2021 03/19/2021 03/18/2021  Weight (lbs) 192 lb 7.4 oz 180 lb 5.4 oz 180 lb  Weight (kg) 87.3 kg 81.8 kg 81.647 kg     Body mass index is 35.2 kg/m.  General:  Well nourished, well developed, in no acute distress HEENT: normal Lymph: no adenopathy Neck: no JVD Endocrine:  No thryomegaly Cardiac:  normal S1, S2; RRR; 3 out of 6 systolic ejection murmur Lungs:  clear to auscultation bilaterally, no wheezing, rhonchi or rales  Abd: soft,  nontender, no hepatomegaly  Ext: Trace bilateral lower extremity edema Musculoskeletal:  No deformities, BUE and BLE strength normal and equal Skin: warm and dry  Neuro:  CNs 2-12 intact, no focal abnormalities noted Psych:  Normal affect   EKG:  The EKG was personally reviewed and demonstrates:  sinus Telemetry:  Telemetry was personally reviewed and demonstrates:  sinus  Relevant CV Studies: Echo 03/19/21 ECHOCARDIOGRAM REPORT    Patient Name:   Sonya Beck Date of Exam: 03/19/2021  Medical Rec #:  [id number]        Height:       62.0 in  Accession #:    [id number]       Weight:       180.0 lb  Date of Birth:  1951/01/29        BSA:          1.828 m  Patient Age:    70 years         BP:           117/67 mmHg  Patient Gender: F                HR:           75 bpm.  Exam Location:  Inpatient   Procedure: 2D Echo, Cardiac Doppler and Color Doppler   Indications:    Aortic stenosis     History:        Patient has no prior history of Echocardiogram  examinations.                  Stroke, Aortic Valve Disease and severe aortic stenosis,                  Signs/Symptoms:Shortness of Breath and ESRD; Risk                  Factors:Dyslipidemia and Obesity.     Sonographer:    Dustin Flock  Referring Phys: [id number] [place]     1. The aortic valve is tricuspid. There  is severe calcifcation of the  aortic valve. There is severe thickening of the aortic valve. Aortic valve  regurgitation is mild. Severe aortic valve stenosis. Aortic valve area, by  VTI measures 0.90 cm. Aortic  valve mean gradient measures 50.0 mmHg. Aortic valve Vmax measures 4.61  m/s.   2. The mitral valve is degenerative. Moderate to severe mitral valve  regurgitation. No evidence of mitral stenosis. Moderate to severe mitral  annular calcification.   3. Left ventricular ejection fraction, by estimation, is 55 to 60%. The  left ventricle has normal function. The left ventricle has  no regional  wall motion abnormalities. There is moderate concentric left ventricular  hypertrophy. Left ventricular  diastolic function could not be evaluated.   4. Right ventricular systolic function is mildly reduced. The right  ventricular size is mildly enlarged. There is moderately elevated  pulmonary artery systolic pressure. The estimated right ventricular  systolic pressure is 40.9 mmHg.   5. Left atrial size was mild to moderately dilated.   6. Right atrial size was mildly dilated.   7. The inferior vena cava is dilated in size with <50% respiratory  variability, suggesting right atrial pressure of 15 mmHg.   8. A small pericardial effusion is present. The pericardial effusion is  circumferential. There is no evidence of cardiac tamponade.   FINDINGS   Left Ventricle: Left ventricular ejection fraction, by estimation, is 55  to 60%. The left ventricle has normal function. The left ventricle has no  regional wall motion abnormalities. The left ventricular internal cavity  size was normal in size. There is   moderate concentric left ventricular hypertrophy. Left ventricular  diastolic function could not be evaluated due to mitral annular  calcification (moderate or greater). Left ventricular diastolic function  could not be evaluated.   Right Ventricle: The right ventricular size is mildly enlarged. No  increase in right ventricular wall thickness. Right ventricular systolic  function is mildly reduced. There is moderately elevated pulmonary artery  systolic pressure. The tricuspid  regurgitant velocity is 3.06 m/s, and with an assumed right atrial  pressure of 15 mmHg, the estimated right ventricular systolic pressure is  81.1 mmHg.   Left Atrium: Left atrial size was mild to moderately dilated.   Right Atrium: Right atrial size was mildly dilated.   Pericardium: A small pericardial effusion is present. The pericardial  effusion is circumferential. There is no evidence  of cardiac tamponade.   Mitral Valve: The mitral valve is degenerative in appearance. Moderate to  severe mitral annular calcification. Moderate to severe mitral valve  regurgitation. No evidence of mitral valve stenosis. MV peak gradient,  13.1 mmHg. The mean mitral valve  gradient is 5.0 mmHg.   Tricuspid Valve: The tricuspid valve is grossly normal. Tricuspid valve  regurgitation is mild . No evidence of tricuspid stenosis.   Aortic Valve: The aortic valve is tricuspid. There is severe calcifcation  of the aortic valve. There is severe thickening of the aortic valve.  Aortic valve regurgitation is mild. Aortic regurgitation PHT measures 344  msec. Severe aortic stenosis is  present. Aortic valve mean gradient measures 50.0 mmHg. Aortic valve peak  gradient measures 85.0 mmHg. Aortic valve area, by VTI measures 0.90 cm.   Pulmonic Valve: The pulmonic valve was grossly normal. Pulmonic valve  regurgitation is mild. No evidence of pulmonic stenosis.   Aorta: The aortic root and ascending aorta are structurally normal, with  no evidence of dilitation.   Venous: The inferior vena cava  is dilated in size with less than 50%  respiratory variability, suggesting right atrial pressure of 15 mmHg.   IAS/Shunts: The atrial septum is grossly normal.      LEFT VENTRICLE  PLAX 2D  LVIDd:         5.20 cm      Diastology  LVIDs:         4.00 cm      LV e' medial:    4.79 cm/s  LV PW:         1.60 cm      LV E/e' medial:  26.5  LV IVS:        1.70 cm      LV e' lateral:   6.42 cm/s  LVOT diam:     2.20 cm      LV E/e' lateral: 19.8  LV SV:         95  LV SV Index:   52  LVOT Area:     3.80 cm     LV Volumes (MOD)  LV vol d, MOD A4C: 195.0 ml  LV vol s, MOD A4C: 67.2 ml  LV SV MOD A4C:     195.0 ml   RIGHT VENTRICLE  RV Basal diam:  4.90 cm  RV S prime:     8.05 cm/s  TAPSE (M-mode): 1.9 cm   LEFT ATRIUM             Index       RIGHT ATRIUM           Index  LA diam:        4.80  cm 2.63 cm/m  RA Area:     20.50 cm  LA Vol (A2C):   79.3 ml 43.38 ml/m RA Volume:   66.20 ml  36.22 ml/m  LA Vol (A4C):   70.1 ml 38.35 ml/m  LA Biplane Vol: 79.0 ml 43.22 ml/m   AORTIC VALVE  AV Area (Vmax):    0.82 cm  AV Area (Vmean):   0.80 cm  AV Area (VTI):     0.90 cm  AV Vmax:           461.00 cm/s  AV Vmean:          331.000 cm/s  AV VTI:            1.060 m  AV Peak Grad:      85.0 mmHg  AV Mean Grad:      50.0 mmHg  LVOT Vmax:         99.20 cm/s  LVOT Vmean:        70.000 cm/s  LVOT VTI:          0.251 m  LVOT/AV VTI ratio: 0.24  AI PHT:            344 msec     AORTA  Ao Root diam: 2.90 cm   MITRAL VALVE                TRICUSPID VALVE  MV Area (PHT): 3.42 cm     TR Peak grad:   37.5 mmHg  MV Area VTI:   2.43 cm     TR Vmax:        306.00 cm/s  MV Peak grad:  13.1 mmHg  MV Mean grad:  5.0 mmHg     SHUNTS  MV Vmax:       1.81 m/s     Systemic VTI:  0.25 m  MV Vmean:      99.0 cm/s    Systemic Diam: 2.20 cm  MV Decel Time: 222 msec  MV E velocity: 127.00 cm/s  MV A velocity: 64.50 cm/s  MV E/A ratio:  1.97   Sonya Chiquito MD  Electronically signed by Sonya Chiquito MD  Signature Date/Time: 03/19/2021/1:46:03 PM       Laboratory Data:  High Sensitivity Troponin:  No results for input(s): TROPONINIHS in the last 720 hours.   Chemistry Recent Labs  Lab 03/18/21 1824 03/19/21 0520 03/20/21 0432  NA 138 137 137  K 3.9 3.7 3.1*  CL 97* 97* 98  CO2 27 25 27   GLUCOSE 97 92 139*  BUN 39* 41* 21  CREATININE 7.59* 7.85* 5.05*  CALCIUM 8.5* 8.4* 8.5*  GFRNONAA 5* 5* 9*  ANIONGAP 14 15 12     Recent Labs  Lab 03/18/21 1824 03/19/21 0520 03/20/21 0432  PROT 7.0 6.7  --   ALBUMIN 3.3* 3.1* 3.0*  AST 14* 13*  --   ALT 12 11  --   ALKPHOS 53 47  --   BILITOT 0.8 0.9  --    Hematology Recent Labs  Lab 03/18/21 1824 03/19/21 0520 03/20/21 0432  WBC 5.5 4.4 5.0  RBC 2.77* 2.55* 2.70*  HGB 8.0* 7.4* 7.9*  HCT 27.0* 24.2* 25.2*  MCV 97.5  94.9 93.3  MCH 28.9 29.0 29.3  MCHC 29.6* 30.6 31.3  RDW 16.4* 16.4* 16.2*  PLT 216 186 175   BNPNo results for input(s): BNP, PROBNP in the last 168 hours.  DDimer No results for input(s): DDIMER in the last 168 hours.   Radiology/Studies:  DG Chest 2 View  Result Date: 03/18/2021 CLINICAL DATA:  Missed dialysis EXAM: CHEST - 2 VIEW COMPARISON:  03/04/2021 FINDINGS: Cardiomegaly, vascular congestion. No overt edema. No confluent opacities or effusions. No acute bony abnormality. IMPRESSION: Cardiomegaly, vascular congestion. Electronically Signed   By: Rolm Baptise M.D.   On: 03/18/2021 19:15   ECHOCARDIOGRAM COMPLETE  Result Date: 03/19/2021    ECHOCARDIOGRAM REPORT   Patient Name:   Sonya Beck Date of Exam: 03/19/2021 Medical Rec #:  [id number]        Height:       62.0 in Accession #:    [id number]       Weight:       180.0 lb Date of Birth:  1950/12/07        BSA:          1.828 m Patient Age:    1 years         BP:           117/67 mmHg Patient Gender: F                HR:           75 bpm. Exam Location:  Inpatient Procedure: 2D Echo, Cardiac Doppler and Color Doppler Indications:    Aortic stenosis  History:        Patient has no prior history of Echocardiogram examinations.                 Stroke, Aortic Valve Disease and severe aortic stenosis,                 Signs/Symptoms:Shortness of Breath and ESRD; Risk                 Factors:Dyslipidemia and Obesity.  Sonographer:    Dustin Flock Referring Phys: [phone] Iona Beard  J SHALHOUB IMPRESSIONS  1. The aortic valve is tricuspid. There is severe calcifcation of the aortic valve. There is severe thickening of the aortic valve. Aortic valve regurgitation is mild. Severe aortic valve stenosis. Aortic valve area, by VTI measures 0.90 cm. Aortic valve mean gradient measures 50.0 mmHg. Aortic valve Vmax measures 4.61 m/s.  2. The mitral valve is degenerative. Moderate to severe mitral valve regurgitation. No evidence of mitral stenosis.  Moderate to severe mitral annular calcification.  3. Left ventricular ejection fraction, by estimation, is 55 to 60%. The left ventricle has normal function. The left ventricle has no regional wall motion abnormalities. There is moderate concentric left ventricular hypertrophy. Left ventricular diastolic function could not be evaluated.  4. Right ventricular systolic function is mildly reduced. The right ventricular size is mildly enlarged. There is moderately elevated pulmonary artery systolic pressure. The estimated right ventricular systolic pressure is 41.3 mmHg.  5. Left atrial size was mild to moderately dilated.  6. Right atrial size was mildly dilated.  7. The inferior vena cava is dilated in size with <50% respiratory variability, suggesting right atrial pressure of 15 mmHg.  8. A small pericardial effusion is present. The pericardial effusion is circumferential. There is no evidence of cardiac tamponade. FINDINGS  Left Ventricle: Left ventricular ejection fraction, by estimation, is 55 to 60%. The left ventricle has normal function. The left ventricle has no regional wall motion abnormalities. The left ventricular internal cavity size was normal in size. There is  moderate concentric left ventricular hypertrophy. Left ventricular diastolic function could not be evaluated due to mitral annular calcification (moderate or greater). Left ventricular diastolic function could not be evaluated. Right Ventricle: The right ventricular size is mildly enlarged. No increase in right ventricular wall thickness. Right ventricular systolic function is mildly reduced. There is moderately elevated pulmonary artery systolic pressure. The tricuspid regurgitant velocity is 3.06 m/s, and with an assumed right atrial pressure of 15 mmHg, the estimated right ventricular systolic pressure is 24.4 mmHg. Left Atrium: Left atrial size was mild to moderately dilated. Right Atrium: Right atrial size was mildly dilated. Pericardium: A  small pericardial effusion is present. The pericardial effusion is circumferential. There is no evidence of cardiac tamponade. Mitral Valve: The mitral valve is degenerative in appearance. Moderate to severe mitral annular calcification. Moderate to severe mitral valve regurgitation. No evidence of mitral valve stenosis. MV peak gradient, 13.1 mmHg. The mean mitral valve gradient is 5.0 mmHg. Tricuspid Valve: The tricuspid valve is grossly normal. Tricuspid valve regurgitation is mild . No evidence of tricuspid stenosis. Aortic Valve: The aortic valve is tricuspid. There is severe calcifcation of the aortic valve. There is severe thickening of the aortic valve. Aortic valve regurgitation is mild. Aortic regurgitation PHT measures 344 msec. Severe aortic stenosis is present. Aortic valve mean gradient measures 50.0 mmHg. Aortic valve peak gradient measures 85.0 mmHg. Aortic valve area, by VTI measures 0.90 cm. Pulmonic Valve: The pulmonic valve was grossly normal. Pulmonic valve regurgitation is mild. No evidence of pulmonic stenosis. Aorta: The aortic root and ascending aorta are structurally normal, with no evidence of dilitation. Venous: The inferior vena cava is dilated in size with less than 50% respiratory variability, suggesting right atrial pressure of 15 mmHg. IAS/Shunts: The atrial septum is grossly normal.  LEFT VENTRICLE PLAX 2D LVIDd:         5.20 cm      Diastology LVIDs:         4.00 cm  LV e' medial:    4.79 cm/s LV PW:         1.60 cm      LV E/e' medial:  26.5 LV IVS:        1.70 cm      LV e' lateral:   6.42 cm/s LVOT diam:     2.20 cm      LV E/e' lateral: 19.8 LV SV:         95 LV SV Index:   52 LVOT Area:     3.80 cm  LV Volumes (MOD) LV vol d, MOD A4C: 195.0 ml LV vol s, MOD A4C: 67.2 ml LV SV MOD A4C:     195.0 ml RIGHT VENTRICLE RV Basal diam:  4.90 cm RV S prime:     8.05 cm/s TAPSE (M-mode): 1.9 cm LEFT ATRIUM             Index       RIGHT ATRIUM           Index LA diam:        4.80  cm 2.63 cm/m  RA Area:     20.50 cm LA Vol (A2C):   79.3 ml 43.38 ml/m RA Volume:   66.20 ml  36.22 ml/m LA Vol (A4C):   70.1 ml 38.35 ml/m LA Biplane Vol: 79.0 ml 43.22 ml/m  AORTIC VALVE AV Area (Vmax):    0.82 cm AV Area (Vmean):   0.80 cm AV Area (VTI):     0.90 cm AV Vmax:           461.00 cm/s AV Vmean:          331.000 cm/s AV VTI:            1.060 m AV Peak Grad:      85.0 mmHg AV Mean Grad:      50.0 mmHg LVOT Vmax:         99.20 cm/s LVOT Vmean:        70.000 cm/s LVOT VTI:          0.251 m LVOT/AV VTI ratio: 0.24 AI PHT:            344 msec  AORTA Ao Root diam: 2.90 cm MITRAL VALVE                TRICUSPID VALVE MV Area (PHT): 3.42 cm     TR Peak grad:   37.5 mmHg MV Area VTI:   2.43 cm     TR Vmax:        306.00 cm/s MV Peak grad:  13.1 mmHg MV Mean grad:  5.0 mmHg     SHUNTS MV Vmax:       1.81 m/s     Systemic VTI:  0.25 m MV Vmean:      99.0 cm/s    Systemic Diam: 2.20 cm MV Decel Time: 222 msec MV E velocity: 127.00 cm/s MV A velocity: 64.50 cm/s MV E/A ratio:  1.97 Sonya Chiquito MD Electronically signed by Sonya Chiquito MD Signature Date/Time: 03/19/2021/1:46:03 PM    Final      STS Risk Calculator:  Procedure: Isolated AVR Risk of Mortality: 5.132% Renal Failure: NA Permanent Stroke: 2.233% Prolonged Ventilation: 18.655% DSW Infection: 0.207% Reoperation: 3.464% Morbidity or Mortality: 26.014% Short Length of Stay: 15.074% Long Length of Stay: 10.472%  ________________________  [facility] Cardiomyopathy Questionnaire  KCCQ-12 03/20/2021  1 a. Ability to shower/bathe Quite a bit limited  1 b. Ability  to walk 1 block Extremely limited  1 c. Ability to hurry/jog Other, Did not do  2. Edema feet/ankles/legs 3+ times a week, not every day  3. Limited by fatigue At least once a day  4. Limited by dyspnea All of the time  5. Sitting up / on 3+ pillows 3+ times a week, not every day  6. Limited enjoyment of life Extremely limited  7. Rest of life w/ symptoms  Not at all satisfied  8 a. Participation in hobbies Severely limited  8 b. Participation in chores Severely limited  8 c. Visiting family/friends Severely limited      Assessment and Plan:   Sonya Beck is a 70 y.o. female with symptoms of severe, stage D1 aortic stenosis with NYHA Class III symptoms. I have reviewed the patient's recent echocardiogram which is notable for preserved LV systolic function (EF 95%) and severe aortic stenosis with peak gradient of 85 mm hg and mean transvalvular gradient of 60mmhg. The patient's dimensionless index is 0.24 and calculated aortic valve area is 0.82 cm.    I have reviewed the natural history of aortic stenosis with the patient. We have discussed the limitations of medical therapy and the poor prognosis associated with symptomatic aortic stenosis. We have reviewed potential treatment options, including palliative medical therapy, conventional surgical aortic valve replacement, and transcatheter aortic valve replacement. We discussed treatment options in the context of this patient's specific comorbid medical conditions.    The patient's predicted risk of mortality with conventional aortic valve replacement is 5.132% primarily based on obesity, metastatic cancer, end-stage renal disease, previous stroke, anemia. Other significant comorbid conditions include severe physical deconditioning.   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  She was told by her oncologist in [place] that she did not have a long life expectancy but was never given a specific timeline.  She was offered palliative immunotherapy that she would consider continuing if it would prolong her life.  She does have severe aortic stenosis, but I am not sure that this would change her overall medical trajectory.  She is currently admitted for acute  heart failure secondary to missed hemodialysis sessions, but she has never been previously admitted for heart failure outside this admission.  I think the best thing to do at this point would be to get her in with our oncology team here at [facility] to get a better idea of her life expectancy with respect to her metastatic cancer and make a decision about consideration of TAVR at that point.  Proceeding with TAVR would be to improve her symptoms so that she can live out the rest of her life with better quality.   Dr. Burt Knack to follow.      New York Heart Association (NYHA) Functional Class NYHA Class III        For questions or updates, please contact [facility] Please consult [email or web address] for contact info under    Signed, Angelena Form, PA-C  03/20/2021 10:52 AM  Patient seen, examined. Available data reviewed. Agree with findings, assessment, and plan as outlined by Nell Range, PA-C.  The patient is independently interviewed and examined.  Her sons are at the bedside.  She is alert, oriented, in no distress.  HEENT is normal, JVP is normal, carotid upstrokes are normal with soft bilateral bruits, lung fields are clear bilaterally, heart is regular  rate and rhythm with a 3/6 harsh crescendo decrescendo murmur best heard at the left lower sternal border, no diastolic murmur is present, the patient's aortic stenosis murmur is late peaking with diminished A2.  Abdomen is soft and nontender, no organomegaly.  Extremities have no edema.  I have personally reviewed the patient's echo images which demonstrate preserved LV systolic function, severe calcification and restriction of the aortic valve, severe mitral annular calcification, Doppler findings consistent with severe aortic stenosis with peak and mean gradients of 85 and 50 mmHg, respectively.  The dimensionless index is less than 0.25.  There is moderately severe mitral regurgitation present.  The patient's clinical symptoms are  consistent with New York Heart Association functional class III symptoms of acute on chronic diastolic heart failure in the setting of severe valvular heart disease.  As outlined above, TAVR is a reasonable treatment consideration in this patient with severe symptomatic aortic stenosis.  However, she is noted to have metastatic renal cell carcinoma.  There are questions about her prognosis that are not entirely clear to me.  The patient has recently relocated to [place] from [place].  She is establishing with oncology here next week.  We plan to discuss specific issues about her overall outlook and treatment plan with oncology once our local oncologist has the opportunity to review her case and perform any further staging studies that are indicated.  If the patient turns out to have a reasonable intermediate prognosis with life expectancy clearly greater than 12 months, it would be reasonable to work her up for TAVR as her aortic stenosis appears to be primarily driving her marked functional limitation with exertional dyspnea associated with low-level activity.  We also discussed the importance of end-stage renal disease as it impacts outcomes with any intervention.  The patient would like to proceed with any treatment that would help her quality of life if at all possible.  Our team will follow up as an outpatient once she has been seen by oncology.  As part of her evaluation today, I reviewed the TAVR procedure with the patient and her sons.  I reviewed the work-up involved which would include right and left heart catheterization as well as CT angiography studies of the chest, abdomen, and pelvis and a gated cardiac CTA.  The studies will be deferred until she undergoes oncology evaluation.  Sherren Mocha, M.D. 03/20/2021 4:07 PM

## 2021-03-21 LAB — PARATHYROID HORMONE, INTACT (NO CA): PTH: 338 pg/mL — ABNORMAL HIGH (ref 15–65)

## 2021-03-21 MED ORDER — CYANOCOBALAMIN 1000 MCG PO TABS: 1000.0000 ug | ORAL_TABLET | Freq: Every day | ORAL | 0 refills | Status: DC

## 2021-03-21 MED ORDER — MIDODRINE HCL 10 MG PO TABS: 10.0000 mg | ORAL_TABLET | ORAL | 1 refills | Status: DC

## 2021-03-21 NOTE — Plan of Care (Signed)
  Problem: Education: Goal: Knowledge of General Education information will improve Description: Including pain rating scale, medication(s)/side effects and non-pharmacologic comfort measures 03/21/2021 1615 by Vira Agar, RN Outcome: Completed/Met 03/21/2021 1429 by Vira Agar, RN Outcome: Progressing   Problem: Health Behavior/Discharge Planning: Goal: Ability to manage health-related needs will improve Outcome: Completed/Met   Problem: Clinical Measurements: Goal: Ability to maintain clinical measurements within normal limits will improve Outcome: Completed/Met Goal: Will remain free from infection Outcome: Completed/Met Goal: Diagnostic test results will improve Outcome: Completed/Met Goal: Respiratory complications will improve Outcome: Completed/Met Goal: Cardiovascular complication will be avoided Outcome: Completed/Met   Problem: Activity: Goal: Risk for activity intolerance will decrease Outcome: Completed/Met   Problem: Nutrition: Goal: Adequate nutrition will be maintained 03/21/2021 1615 by Vira Agar, RN Outcome: Completed/Met 03/21/2021 1429 by Vira Agar, RN Outcome: Progressing   Problem: Coping: Goal: Level of anxiety will decrease 03/21/2021 1615 by Vira Agar, RN Outcome: Completed/Met 03/21/2021 1429 by Vira Agar, RN Outcome: Progressing   Problem: Elimination: Goal: Will not experience complications related to bowel motility Outcome: Completed/Met Goal: Will not experience complications related to urinary retention Outcome: Completed/Met   Problem: Pain Managment: Goal: General experience of comfort will improve Outcome: Completed/Met   Problem: Safety: Goal: Ability to remain free from injury will improve 03/21/2021 1615 by Vira Agar, RN Outcome: Completed/Met 03/21/2021 1429 by Vira Agar, RN Outcome: Progressing   Problem: Skin Integrity: Goal: Risk for impaired skin integrity will decrease Outcome:  Completed/Met   Problem: Education: Goal: Knowledge of disease and its progression will improve 03/21/2021 1615 by Vira Agar, RN Outcome: Completed/Met 03/21/2021 1429 by Vira Agar, RN Outcome: Progressing Goal: Individualized Educational Video(s) Outcome: Completed/Met   Problem: Fluid Volume: Goal: Compliance with measures to maintain balanced fluid volume will improve Outcome: Completed/Met   Problem: Health Behavior/Discharge Planning: Goal: Ability to manage health-related needs will improve Outcome: Completed/Met   Problem: Nutritional: Goal: Ability to make healthy dietary choices will improve Outcome: Completed/Met   Problem: Clinical Measurements: Goal: Complications related to the disease process, condition or treatment will be avoided or minimized Outcome: Completed/Met

## 2021-03-21 NOTE — Plan of Care (Signed)
  Problem: Education: Goal: Knowledge of General Education information will improve Description: Including pain rating scale, medication(s)/side effects and non-pharmacologic comfort measures Outcome: Progressing   Problem: Nutrition: Goal: Adequate nutrition will be maintained Outcome: Progressing   Problem: Coping: Goal: Level of anxiety will decrease Outcome: Progressing   Problem: Safety: Goal: Ability to remain free from injury will improve Outcome: Progressing   Problem: Education: Goal: Knowledge of disease and its progression will improve Outcome: Progressing

## 2021-03-21 NOTE — Discharge Summary (Signed)
Physician Discharge Summary  Sonya Beck JGG:[id number] DOB: 07/23/1951 DOA: 03/18/2021  PCP: Ronnell Freshwater, NP  Admit date: 03/18/2021 Discharge date: 03/21/2021  Admitted From: Home  Disposition: Home   Recommendations for Outpatient Follow-up:  Follow up with PCP in 1-2 weeks Please obtain BMP/CBC in one week Needs to follow up with Oncology for further discussion in regards prognosis, any other staging test as indicated to determine if patient will benefit from [facility].   Home Health: [facility]>   Discharge Condition: Stable.  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   Patient recently moved to [place] with her son approximately 2 weeks ago from [place].  Over the past 2 weeks she has been receiving hemodialysis services at a local dialysis center in [place], however since Saturday she was told that she needed to find a permanent dialysis arrangement.  Since then she has not received dialysis.     1-Acute on Chronic Diastolic Heart Failure Exacerbation: Volume overload, in the setting of missing hemodialysis: -Patient presents with volume overload, chest x-ray with vascular congestion. -Nephrology has been consulted for hemodialysis. -ECHO; severe aortic stenosis. -Underwent HD 6/29..7/02.     2-Severe Aortic Stenosis: -Echo ordered. -Patient states that her TAVR was recently canceled by her cardiologist after she was found to have metastatic lesion to the liver. -Cardiology consulted. Patient looking for second opinion.   -See Dr cooper consult note. Plan is for patient to follow up with oncologist to establish care, evaluation  for prognosis. If patient has intermediate prognosis with life expectancy clearly greater than 12 month cardiology will consider further evaluation for TAVAR>   3-GERD: Continue with PPI   History of DVT: Continue with Eliquis   Metastatic renal cell carcinoma to liver: -Diagnosed 2018 after left renal mass was identified -Patient is status post partial nephrectomy 2018 -Patient was found to have numerous metastatic lesions in the liver via CT [place] 10/2020 -FNA biopsy of the liver confirm these lesions were renal cell carcinoma in May -Patient reports receiving 1 episode of palliative chemotherapy on 6/9 -I have made arrangement for Follow up with Dr Alen Blew at [facility] center July 8 at 2 PM/    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 Received IV iron.    ESRD  on hemodialysis: Had two section of HD during Hospitalization  She was started on Midodrine prior to HD to help with Low BP.  Patient has an appointment at [facility] hemodialysis clinic on Tuesday Thursday and Saturday, as scheduled received time at 10:25 AM.  First hemodialysis to start on Tuesday, 03/24/2021.  Patient will need to arrive at 10;10 AM    Estimated body mass index is 35.2 kg/m as calculated from the following:   Height as of an earlier encounter on 03/18/21: 5\' 2"  (1.575 m).   Weight as of this encounter: 87.3 kg.    Discharge Diagnoses:  Principal Problem:   Volume overload Active Problems:   Severe aortic stenosis   Gastroesophageal reflux disease without esophagitis   History of DVT of lower extremity   Metastatic renal cell carcinoma to liver (  [place])   Anemia due to end stage renal disease ([place])   ESRD on hemodialysis [facility])    Discharge Instructions  Discharge Instructions     Diet - low sodium heart healthy   Complete by: As directed    Increase  activity slowly   Complete by: As directed       Allergies as of 03/21/2021       Reactions   Heparin Other (See Comments)   HITT   Penicillins Swelling   Swelling in mouth        Medication List     STOP taking these medications    furosemide 40 MG tablet Commonly known as: LASIX   metolazone 5 MG tablet Commonly known as: ZAROXOLYN       TAKE these medications    apixaban 5 MG Tabs tablet Commonly known as: Eliquis Take 1 tablet (5 mg total) by mouth 2 (two) times daily.   cetirizine 5 MG tablet Commonly known as: ZYRTEC Take 5 mg by mouth daily as needed for allergies.   cyanocobalamin 1000 MCG tablet Take 1 tablet (1,000 mcg total) by mouth daily.   midodrine 10 MG tablet Commonly known as: PROAMATINE Take 1 tablet (10 mg total) by mouth Every Tuesday,Thursday,and Saturday with dialysis.   pantoprazole 40 MG tablet Commonly known as: PROTONIX Take 1 tablet (40 mg total) by mouth daily.        Follow-up Information     Wyatt Portela, MD Follow up in 1 week(s).   Specialty: Oncology Why: Appointment July 8 at 2:00PM./ Contact information: [place] [address] [phone]         Sherren Mocha, MD. Call in 1 week(s).   Specialty: Cardiology Contact information: [address] [place] [address] [phone]                Allergies  Allergen Reactions   Heparin Other (See Comments)    HITT   Penicillins Swelling    Swelling in mouth     Consultations: Nephrology Cardiology    Procedures/Studies: DG Chest 2 View  Result Date: 03/18/2021 CLINICAL DATA:  Missed dialysis EXAM: CHEST - 2 VIEW COMPARISON:  03/04/2021 FINDINGS: Cardiomegaly, vascular congestion. No overt edema. No confluent opacities or effusions. No acute bony abnormality. IMPRESSION: Cardiomegaly, vascular congestion. Electronically Signed   By: Rolm Baptise M.D.   On: 03/18/2021 19:15   DG Chest Port 1  View  Result Date: 03/04/2021 CLINICAL DATA:  70 year old female with shortness of breath. EXAM: PORTABLE CHEST 1 VIEW COMPARISON:  None. FINDINGS: There is mild cardiomegaly with mild central vascular congestion. No focal consolidation, pleural effusion, or pneumothorax. Atherosclerotic calcification of the aortic arch. No acute osseous pathology. IMPRESSION: Mild cardiomegaly with mild central vascular congestion. No focal consolidation. Electronically Signed   By: Anner Crete M.D.   On: 03/04/2021 15:21   ECHOCARDIOGRAM COMPLETE  Result Date: 03/19/2021    ECHOCARDIOGRAM REPORT   Patient Name:   NAJLA AUGHENBAUGH Date of Exam: 03/19/2021 Medical Rec #:  [id number]        Height:       62.0 in Accession #:    [id number]       Weight:       180.0 lb Date of Birth:  08-22-1951        BSA:          1.828 m Patient Age:    82 years  BP:           117/67 mmHg Patient Gender: F                HR:           75 bpm. Exam Location:  Inpatient Procedure: 2D Echo, Cardiac Doppler and Color Doppler Indications:    Aortic stenosis  History:        Patient has no prior history of Echocardiogram examinations.                 Stroke, Aortic Valve Disease and severe aortic stenosis,                 Signs/Symptoms:Shortness of Breath and ESRD; Risk                 Factors:Dyslipidemia and Obesity.  Sonographer:    Dustin Flock Referring Phys: [id number] Rudd  1. The aortic valve is tricuspid. There is severe calcifcation of the aortic valve. There is severe thickening of the aortic valve. Aortic valve regurgitation is mild. Severe aortic valve stenosis. Aortic valve area, by VTI measures 0.90 cm. Aortic valve mean gradient measures 50.0 mmHg. Aortic valve Vmax measures 4.61 m/s.  2. The mitral valve is degenerative. Moderate to severe mitral valve regurgitation. No evidence of mitral stenosis. Moderate to severe mitral annular calcification.  3. Left ventricular ejection fraction, by  estimation, is 55 to 60%. The left ventricle has normal function. The left ventricle has no regional wall motion abnormalities. There is moderate concentric left ventricular hypertrophy. Left ventricular diastolic function could not be evaluated.  4. Right ventricular systolic function is mildly reduced. The right ventricular size is mildly enlarged. There is moderately elevated pulmonary artery systolic pressure. The estimated right ventricular systolic pressure is 74.1 mmHg.  5. Left atrial size was mild to moderately dilated.  6. Right atrial size was mildly dilated.  7. The inferior vena cava is dilated in size with <50% respiratory variability, suggesting right atrial pressure of 15 mmHg.  8. A small pericardial effusion is present. The pericardial effusion is circumferential. There is no evidence of cardiac tamponade. FINDINGS  Left Ventricle: Left ventricular ejection fraction, by estimation, is 55 to 60%. The left ventricle has normal function. The left ventricle has no regional wall motion abnormalities. The left ventricular internal cavity size was normal in size. There is  moderate concentric left ventricular hypertrophy. Left ventricular diastolic function could not be evaluated due to mitral annular calcification (moderate or greater). Left ventricular diastolic function could not be evaluated. Right Ventricle: The right ventricular size is mildly enlarged. No increase in right ventricular wall thickness. Right ventricular systolic function is mildly reduced. There is moderately elevated pulmonary artery systolic pressure. The tricuspid regurgitant velocity is 3.06 m/s, and with an assumed right atrial pressure of 15 mmHg, the estimated right ventricular systolic pressure is 28.7 mmHg. Left Atrium: Left atrial size was mild to moderately dilated. Right Atrium: Right atrial size was mildly dilated. Pericardium: A small pericardial effusion is present. The pericardial effusion is circumferential. There is  no evidence of cardiac tamponade. Mitral Valve: The mitral valve is degenerative in appearance. Moderate to severe mitral annular calcification. Moderate to severe mitral valve regurgitation. No evidence of mitral valve stenosis. MV peak gradient, 13.1 mmHg. The mean mitral valve gradient is 5.0 mmHg. Tricuspid Valve: The tricuspid valve is grossly normal. Tricuspid valve regurgitation is mild . No evidence of tricuspid stenosis. Aortic Valve: The aortic valve  is tricuspid. There is severe calcifcation of the aortic valve. There is severe thickening of the aortic valve. Aortic valve regurgitation is mild. Aortic regurgitation PHT measures 344 msec. Severe aortic stenosis is present. Aortic valve mean gradient measures 50.0 mmHg. Aortic valve peak gradient measures 85.0 mmHg. Aortic valve area, by VTI measures 0.90 cm. Pulmonic Valve: The pulmonic valve was grossly normal. Pulmonic valve regurgitation is mild. No evidence of pulmonic stenosis. Aorta: The aortic root and ascending aorta are structurally normal, with no evidence of dilitation. Venous: The inferior vena cava is dilated in size with less than 50% respiratory variability, suggesting right atrial pressure of 15 mmHg. IAS/Shunts: The atrial septum is grossly normal.  LEFT VENTRICLE PLAX 2D LVIDd:         5.20 cm      Diastology LVIDs:         4.00 cm      LV e' medial:    4.79 cm/s LV PW:         1.60 cm      LV E/e' medial:  26.5 LV IVS:        1.70 cm      LV e' lateral:   6.42 cm/s LVOT diam:     2.20 cm      LV E/e' lateral: 19.8 LV SV:         95 LV SV Index:   52 LVOT Area:     3.80 cm  LV Volumes (MOD) LV vol d, MOD A4C: 195.0 ml LV vol s, MOD A4C: 67.2 ml LV SV MOD A4C:     195.0 ml RIGHT VENTRICLE RV Basal diam:  4.90 cm RV S prime:     8.05 cm/s TAPSE (M-mode): 1.9 cm LEFT ATRIUM             Index       RIGHT ATRIUM           Index LA diam:        4.80 cm 2.63 cm/m  RA Area:     20.50 cm LA Vol (A2C):   79.3 ml 43.38 ml/m RA Volume:   66.20  ml  36.22 ml/m LA Vol (A4C):   70.1 ml 38.35 ml/m LA Biplane Vol: 79.0 ml 43.22 ml/m  AORTIC VALVE AV Area (Vmax):    0.82 cm AV Area (Vmean):   0.80 cm AV Area (VTI):     0.90 cm AV Vmax:           461.00 cm/s AV Vmean:          331.000 cm/s AV VTI:            1.060 m AV Peak Grad:      85.0 mmHg AV Mean Grad:      50.0 mmHg LVOT Vmax:         99.20 cm/s LVOT Vmean:        70.000 cm/s LVOT VTI:          0.251 m LVOT/AV VTI ratio: 0.24 AI PHT:            344 msec  AORTA Ao Root diam: 2.90 cm MITRAL VALVE                TRICUSPID VALVE MV Area (PHT): 3.42 cm     TR Peak grad:   37.5 mmHg MV Area VTI:   2.43 cm     TR Vmax:        306.00 cm/s MV  Peak grad:  13.1 mmHg MV Mean grad:  5.0 mmHg     SHUNTS MV Vmax:       1.81 m/s     Systemic VTI:  0.25 m MV Vmean:      99.0 cm/s    Systemic Diam: 2.20 cm MV Decel Time: 222 msec MV E velocity: 127.00 cm/s MV A velocity: 64.50 cm/s MV E/A ratio:  1.97 Eleonore Chiquito MD Electronically signed by Eleonore Chiquito MD Signature Date/Time: 03/19/2021/1:46:03 PM    Final      Subjective: She is feeling well.   Discharge Exam: Vitals:   03/21/21 1238 03/21/21 1240  BP: 111/66 108/64  Pulse: 70 74  Resp: (!) 21 (!) 25  Temp:  98 F (36.7 C)  SpO2:  98%     General: Pt is alert, awake, not in acute distress Cardiovascular: RRR, S1/S2 +, no rubs, no gallops Respiratory: CTA bilaterally, no wheezing, no rhonchi Abdominal: Soft, NT, ND, bowel sounds + Extremities: no edema, no cyanosis    The results of significant diagnostics from this hospitalization (including imaging, microbiology, ancillary and laboratory) are listed below for reference.     Microbiology: Recent Results (from the past 240 hour(s))  Resp Panel by RT-PCR (Flu A&B, Covid) Nasopharyngeal Swab     Status: None   Collection Time: 03/18/21  1:23 AM   Specimen: Nasopharyngeal Swab; Nasopharyngeal(NP) swabs in vial transport medium  Result Value Ref Range Status   SARS Coronavirus 2 by  RT PCR NEGATIVE NEGATIVE Final    Comment: (NOTE) SARS-CoV-2 target nucleic acids are NOT DETECTED.  The SARS-CoV-2 RNA is generally detectable in upper respiratory specimens during the acute phase of infection. The lowest concentration of SARS-CoV-2 viral copies this assay can detect is 138 copies/mL. A negative result does not preclude SARS-Cov-2 infection and should not be used as the sole basis for treatment or other patient management decisions. A negative result may occur with  improper specimen collection/handling, submission of specimen other than nasopharyngeal swab, presence of viral mutation(s) within the areas targeted by this assay, and inadequate number of viral copies(<138 copies/mL). A negative result must be combined with clinical observations, patient history, and epidemiological information. The expected result is Negative.  Fact Sheet for Patients:  EntrepreneurPulse.com.au  Fact Sheet for Healthcare Providers:  IncredibleEmployment.be  This test is no t yet approved or cleared by the [place] FDA and  has been authorized for detection and/or diagnosis of SARS-CoV-2 by FDA under an Emergency Use Authorization (EUA). This EUA will remain  in effect (meaning this test can be used) for the duration of the COVID-19 declaration under Section 564(b)(1) of the Act, 21 U.S.C.section 360bbb-3(b)(1), unless the authorization is terminated  or revoked sooner.       Influenza A by PCR NEGATIVE NEGATIVE Final   Influenza B by PCR NEGATIVE NEGATIVE Final    Comment: (NOTE) The Xpert Xpress SARS-CoV-2/FLU/RSV plus assay is intended as an aid in the diagnosis of influenza from Nasopharyngeal swab specimens and should not be used as a sole basis for treatment. Nasal washings and aspirates are unacceptable for Xpert Xpress SARS-CoV-2/FLU/RSV testing.  Fact Sheet for Patients: EntrepreneurPulse.com.au  Fact Sheet  for Healthcare Providers: IncredibleEmployment.be  This test is not yet approved or cleared by the [place] FDA and has been authorized for detection and/or diagnosis of SARS-CoV-2 by FDA under an Emergency Use Authorization (EUA). This EUA will remain in effect (meaning this test can be used) for the duration of  the COVID-19 declaration under Section 564(b)(1) of the Act, 21 U.S.C. section 360bbb-3(b)(1), unless the authorization is terminated or revoked.  Performed at [facility], [place] [address], [place], [place] [address]      Labs: BNP (last 3 results) Recent Labs    03/04/21 1553  BNP 8,657.8*   Basic Metabolic Panel: Recent Labs  Lab 03/18/21 1824 03/19/21 0520 03/20/21 0432  NA 138 137 137  K 3.9 3.7 3.1*  CL 97* 97* 98  CO2 27 25 27   GLUCOSE 97 92 139*  BUN 39* 41* 21  CREATININE 7.59* 7.85* 5.05*  CALCIUM 8.5* 8.4* 8.5*  PHOS  --   --  4.0   Liver Function Tests: Recent Labs  Lab 03/18/21 1824 03/19/21 0520 03/20/21 0432  AST 14* 13*  --   ALT 12 11  --   ALKPHOS 53 47  --   BILITOT 0.8 0.9  --   PROT 7.0 6.7  --   ALBUMIN 3.3* 3.1* 3.0*   No results for input(s): LIPASE, AMYLASE in the last 168 hours. No results for input(s): AMMONIA in the last 168 hours. CBC: Recent Labs  Lab 03/18/21 1824 03/19/21 0520 03/20/21 0432  WBC 5.5 4.4 5.0  NEUTROABS 3.7 3.0  --   HGB 8.0* 7.4* 7.9*  HCT 27.0* 24.2* 25.2*  MCV 97.5 94.9 93.3  PLT 216 186 175   Cardiac Enzymes: No results for input(s): CKTOTAL, CKMB, CKMBINDEX, TROPONINI in the last 168 hours. BNP: Invalid input(s): POCBNP CBG: No results for input(s): GLUCAP in the last 168 hours. D-Dimer No results for input(s): DDIMER in the last 72 hours. Hgb A1c No results for input(s): HGBA1C in the last 72 hours. Lipid Profile No results for input(s): CHOL, HDL, LDLCALC, TRIG, CHOLHDL, LDLDIRECT in the last 72 hours. Thyroid function studies Recent Labs     03/19/21 0520  TSH 1.511   Anemia work up Recent Labs    03/19/21 0520 03/19/21 1339  VITAMINB12 250  --   FOLATE 10.9  --   FERRITIN  --  1,215*  TIBC 241*  --   IRON 34  --    Urinalysis No results found for: COLORURINE, APPEARANCEUR, LABSPEC, [place], GLUCOSEU, HGBUR, BILIRUBINUR, KETONESUR, PROTEINUR, UROBILINOGEN, NITRITE, LEUKOCYTESUR Sepsis Labs Invalid input(s): PROCALCITONIN,  WBC,  LACTICIDVEN Microbiology Recent Results (from the past 240 hour(s))  Resp Panel by RT-PCR (Flu A&B, Covid) Nasopharyngeal Swab     Status: None   Collection Time: 03/18/21  1:23 AM   Specimen: Nasopharyngeal Swab; Nasopharyngeal(NP) swabs in vial transport medium  Result Value Ref Range Status   SARS Coronavirus 2 by RT PCR NEGATIVE NEGATIVE Final    Comment: (NOTE) SARS-CoV-2 target nucleic acids are NOT DETECTED.  The SARS-CoV-2 RNA is generally detectable in upper respiratory specimens during the acute phase of infection. The lowest concentration of SARS-CoV-2 viral copies this assay can detect is 138 copies/mL. A negative result does not preclude SARS-Cov-2 infection and should not be used as the sole basis for treatment or other patient management decisions. A negative result may occur with  improper specimen collection/handling, submission of specimen other than nasopharyngeal swab, presence of viral mutation(s) within the areas targeted by this assay, and inadequate number of viral copies(<138 copies/mL). A negative result must be combined with clinical observations, patient history, and epidemiological information. The expected result is Negative.  Fact Sheet for Patients:  EntrepreneurPulse.com.au  Fact Sheet for Healthcare Providers:  IncredibleEmployment.be  This test is no t yet  approved or cleared by the [place] and  has been authorized for detection and/or diagnosis of SARS-CoV-2 by FDA under an Emergency Use  Authorization (EUA). This EUA will remain  in effect (meaning this test can be used) for the duration of the COVID-19 declaration under Section 564(b)(1) of the Act, 21 U.S.C.section 360bbb-3(b)(1), unless the authorization is terminated  or revoked sooner.       Influenza A by PCR NEGATIVE NEGATIVE Final   Influenza B by PCR NEGATIVE NEGATIVE Final    Comment: (NOTE) The Xpert Xpress SARS-CoV-2/FLU/RSV plus assay is intended as an aid in the diagnosis of influenza from Nasopharyngeal swab specimens and should not be used as a sole basis for treatment. Nasal washings and aspirates are unacceptable for Xpert Xpress SARS-CoV-2/FLU/RSV testing.  Fact Sheet for Patients: EntrepreneurPulse.com.au  Fact Sheet for Healthcare Providers: IncredibleEmployment.be  This test is not yet approved or cleared by the [place] FDA and has been authorized for detection and/or diagnosis of SARS-CoV-2 by FDA under an Emergency Use Authorization (EUA). This EUA will remain in effect (meaning this test can be used) for the duration of the COVID-19 declaration under Section 564(b)(1) of the Act, 21 U.S.C. section 360bbb-3(b)(1), unless the authorization is terminated or revoked.  Performed at [facility], [place] [address], [place], [place] [address]      Time coordinating discharge: 40 minutes  SIGNED:   Elmarie Shiley, MD  [facility]

## 2021-03-21 NOTE — Progress Notes (Signed)
[facility] Progress Note  Subjective: seen in room, no c/o today  Vitals:   03/21/21 1030 03/21/21 1100 03/21/21 1130 03/21/21 1200  BP: (!) 108/58 108/60 (!) 117/51 110/65  Pulse: 69 70 75 71  Resp: (!) 25 17 (!) 23 (!) 27  Temp:      TempSrc:      SpO2:      Weight:        Exam:  alert, nad   no jvd  Chest cta bilat  Cor reg no RG  Abd soft ntnd no ascites   Ext no LE edema   Alert, NF, ox3   LUA AVF+bruit   OP HD: was on HD in [place], now moved to [place] permanently   She was a transient patient at [facility] where she received 5 HD treatments. Last tx at [facility] on 03/14/21   Transient HD Orders at [facility] -- TTS  3.5h  85kg  350/ 600  2/2 bath  L AVF     Assessment/ Plan: Volume overload- CXR showed vasc congestion. Had HD yest w/ 1.8 L off, SOB better. BP's are on the low side. Cont to lower volume w/ HD tomorrow as tolerated.  Severe aortic stenosis - seen by cardiology, not sure TAVR candidate or not yet. They will f/u w her.   BP - bp's low normal, no BP lowering meds here. Will add midodrine 10 mg pre HD.  ESRD- TTS HD. Usual HD was in [place], pt stating she is now moved here permanently. Pt CLIP'd to TTS at [place] 2nd shift, will start on 7/5.  HD here today. After HD today is ready for dc from our standpoint.  Metastatic RCC - w/ liver mets, dx'd in 2018 sp partial nephrectomy 2018. In Feb 2022 in [place] she was found to have numerous livers mets by CT, bx showed these were RCC mets. Pt had palliative chemo on 02/26/21 reportedly. Anemia ckd - no esa w/ malignancy. Hb 7.4- 8 here, transfuse prn. Anemia panel shows: Iron 34, Tsat 14, and Ferritin 1215-plan to give Ferrlecit 125mg  IV X 4 doses-1st dose to be given with next HD. MBD ckd -check phos, pth, Ca 8.5 uncorr Hx DVT - on eliquis at home        [place] 03/21/2021, 12:28 PM   Recent Labs  Lab 03/19/21 0520 03/20/21 0432  K 3.7 3.1*  BUN 41* 21  CREATININE 7.85* 5.05*  CALCIUM 8.4*  8.5*  PHOS  --  4.0  HGB 7.4* 7.9*    Inpatient medications:  apixaban  5 mg Oral BID   feeding supplement (NEPRO CARB STEADY)  237 mL Oral Q24H   midodrine  10 mg Oral Q T,Th,Sa-HD   pantoprazole  40 mg Oral Daily   vitamin B-12  1,000 mcg Oral Daily    ferric gluconate (FERRLECIT) IVPB     acetaminophen **OR** acetaminophen, loperamide, ondansetron **OR** ondansetron (ZOFRAN) IV, polyethylene glycol

## 2021-03-21 NOTE — Progress Notes (Signed)
DISCHARGE NOTE HOME Sonya Beck to be discharged Home per MD order. Discussed prescriptions and follow up appointments with the patient. Prescriptions given to patient; medication list explained in detail. Patient verbalized understanding.  Skin clean, dry and intact without evidence of skin break down, no evidence of skin tears noted. IV catheter discontinued intact. Site without signs and symptoms of complications. Dressing and pressure applied. Pt denies pain at the site currently. No complaints noted.  Patient free of lines, drains, and wounds.   An After Visit Summary (AVS) was printed and given to the patient. Patient escorted via wheelchair, and discharged home via private auto.  Vira Agar, RN

## 2021-03-21 NOTE — TOC Transition Note (Signed)
Transition of Care [facility]) - CM/SW Discharge Note   Patient Details  Name: Varie Machamer MRN: [id number] Date of Birth: 1951/05/17  Transition of Care [facility]) CM/SW Contact:  Bartholomew Crews, RN Phone Number: [phone] 03/21/2021, 3:50 PM   Clinical Narrative:     Spoke with patient on her room phone. Discussed recommendations for [facility] PT. Patient agreeable. Offered choice of [place] agency. Referral accepted by [facility]. Patient already has DME (cane, walker) at home. Family to pick her up to transport home. No further TOC needs identified at this time.   Final next level of care: [place] Barriers to Discharge: No Barriers Identified   Patient Goals and CMS Choice Patient states their goals for this hospitalization and ongoing recovery are:: return home with family CMS Medicare.gov Compare Post Acute Care list provided to:: Patient Choice offered to / list presented to : Patient  Discharge Placement                       Discharge Plan and Services                DME Arranged: N/A DME Agency: NA       HH Arranged: PT HH Agency: [place] Date [place]: 03/21/21 Time [place]: 7034 Representative spoke with at [place]: [place]) Interventions     Readmission Risk Interventions No flowsheet data found.

## 2021-03-22 ENCOUNTER — Telehealth: Payer: Self-pay | Admitting: Nephrology

## 2021-03-22 NOTE — Telephone Encounter (Signed)
Transition of Care Contact from [place]   Date of Discharge: 03/21/21 Date of Contact: 03/22/21 Method of contact: phone Talked to patient   Patient contacted to discuss transition of care form recent hospitaliztion. Patient was admitted to [facility] from 03/18/21 to 03/21/21 with the discharge diagnosis of ESRD in need of HD center and sever aortic stenosis.     Medication changes were reviewed.  Patient will follow up with is outpatient dialysis center 03/24/21.  Other follow up needs include none identified.   Jen Mow, PA-C [facility] Pager: [phone]

## 2021-03-27 ENCOUNTER — Telehealth: Payer: Self-pay

## 2021-03-27 ENCOUNTER — Inpatient Hospital Stay: Payer: Medicare Other | Attending: Oncology | Admitting: Oncology

## 2021-03-27 ENCOUNTER — Other Ambulatory Visit (HOSPITAL_COMMUNITY): Payer: Self-pay

## 2021-03-27 ENCOUNTER — Telehealth: Payer: Self-pay | Admitting: Pharmacist

## 2021-03-27 ENCOUNTER — Other Ambulatory Visit: Payer: Self-pay

## 2021-03-27 VITALS — BP 129/67 | HR 86 | Temp 97.8°F | Resp 18

## 2021-03-27 DIAGNOSIS — C787 Secondary malignant neoplasm of liver and intrahepatic bile duct: Secondary | ICD-10-CM | POA: Diagnosis not present

## 2021-03-27 DIAGNOSIS — N186 End stage renal disease: Secondary | ICD-10-CM | POA: Diagnosis not present

## 2021-03-27 DIAGNOSIS — Z87891 Personal history of nicotine dependence: Secondary | ICD-10-CM

## 2021-03-27 DIAGNOSIS — C649 Malignant neoplasm of unspecified kidney, except renal pelvis: Secondary | ICD-10-CM

## 2021-03-27 DIAGNOSIS — I350 Nonrheumatic aortic (valve) stenosis: Secondary | ICD-10-CM

## 2021-03-27 DIAGNOSIS — D682 Hereditary deficiency of other clotting factors: Secondary | ICD-10-CM | POA: Diagnosis not present

## 2021-03-27 MED ORDER — CABOMETYX 40 MG PO TABS
40.0000 mg | ORAL_TABLET | Freq: Every day | ORAL | 1 refills | Status: DC
  Filled 2021-03-27: qty 30, 30d supply, fill #0

## 2021-03-27 MED ORDER — CABOMETYX 40 MG PO TABS: 40.0000 mg | ORAL_TABLET | Freq: Every day | ORAL | 1 refills | Status: DC

## 2021-03-27 NOTE — Progress Notes (Signed)
Reason for the request:    Kidney cancer  HPI: I was asked by Dr. Tyrell Antonio to evaluate Sonya Beck for the evaluation of kidney cancer.  She is a 70 year old woman with history of end-stage renal disease, factor V Leiden mutation with recurrent deep vein thrombosis as well as congestive heart failure and aortic stenosis.  She was diagnosed with left kidney cancer in 2018 and underwent nephrectomy at that time.  She developed metastatic disease in February 2022.  She underwent percutaneous biopsy for hepatic lesion which confirmed the presence of clear-cell renal cell carcinoma.  This was done while she was living in [place].  She received 1 cycle of immunotherapy utilizing ipilimumab and nivolumab by her oncologist in [place] in early part of June 2022.  She developed severe diarrhea associated with incontinence in addition to nausea and vomiting.  It took her 3 weeks to fully recover.   She relocated in the last few weeks to live with her son.  She was hospitalized on March 19, 2019 2212 July 2 after presenting with symptoms of shortness of breath and volume overload.  Since her discharge, she reports she is feeling better at this time.  She still severely dyspneic with some fluid retention.  She has resumed dialysis and ambulates with the help of walker.  She denies any falls or syncope.   She does not report any headaches, blurry vision, syncope or seizures. Does not report any fevers, chills or sweats.  Does not report any cough, wheezing or hemoptysis.  Does not report any chest pain, palpitation, orthopnea or leg edema.  Does not report any nausea, vomiting or abdominal pain.  Does not report any constipation or diarrhea.  Does not report any skeletal complaints.    Does not report frequency, urgency or hematuria.  Does not report any skin rashes or lesions. Does not report any heat or cold intolerance.  Does not report any lymphadenopathy or petechiae.  Does not report any anxiety or depression.   Remaining review of systems is negative.     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 ([place]) 03/18/2021   Severe aortic stenosis 03/18/2021   Stroke [facility])   :   Past Surgical History:  Procedure Laterality Date   RENAL BIOPSY    :   Current Outpatient Medications:    apixaban (ELIQUIS) 5 MG TABS tablet, Take 1 tablet (5 mg total) by mouth 2 (two) times daily., Disp: 60 tablet, Rfl: 3   cetirizine (ZYRTEC) 5 MG tablet, Take 5 mg by mouth daily as needed for allergies., Disp: , Rfl:    midodrine (PROAMATINE) 10 MG tablet, Take 1 tablet (10 mg total) by mouth Every Tuesday,Thursday,and Saturday with dialysis., Disp: 30 tablet, Rfl: 1   pantoprazole (PROTONIX) 40 MG tablet, Take 1 tablet (40 mg total) by mouth daily., Disp: 30 tablet, Rfl: 3   vitamin B-12 1000 MCG tablet, Take 1 tablet (1,000 mcg total) by mouth daily., Disp: 30 tablet, Rfl: 0:   Allergies  Allergen Reactions   Heparin Other (See Comments)    HITT   Penicillins Swelling    Swelling in mouth   :   Family History  Problem Relation  Age of Onset   Heart disease Neg Hx   :   Social History   Socioeconomic History   Marital status: Single    Spouse name: Not on file   Number of children: Not on file   Years of education: Not on file   Highest education level: Not on file  Occupational History   Not on file  Tobacco Use   Smoking status: Former    Pack years: 0.00    Types: Cigarettes   Smokeless tobacco: Never  Vaping Use   Vaping Use: Never used  Substance and Sexual Activity   Alcohol use: Not Currently   Drug use: Never   Sexual activity: Not Currently    Birth control/protection: Abstinence   Other Topics Concern   Not on file  Social History Narrative   Not on file   Social Determinants of Health   Financial Resource Strain: Not on file  Food Insecurity: Not on file  Transportation Needs: Not on file  Physical Activity: Not on file  Stress: Not on file  Social Connections: Not on file  Intimate Partner Violence: Not on file  :  Pertinent items are noted in HPI.  Exam: Blood pressure 129/67, pulse 86, temperature 97.8 F (36.6 C), temperature source Oral, resp. rate 18. ECOG 1 General appearance: alert and cooperative appeared without distress. Head: atraumatic without any abnormalities. Eyes: conjunctivae/corneas clear. PERRL.  Sclera anicteric. Throat: lips, mucosa, and tongue normal; without oral thrush or ulcers. Resp: clear to auscultation bilaterally without rhonchi, wheezes or dullness to percussion. Cardio: regular rate and rhythm, S1, S2 normal, no murmur, click, rub or gallop GI: soft, non-tender; bowel sounds normal; no masses,  no organomegaly Skin: Skin color, texture, turgor normal. No rashes or lesions Lymph nodes: Cervical, supraclavicular, and axillary nodes normal. Neurologic: Grossly normal without any motor, sensory or deep tendon reflexes. Musculoskeletal: No joint deformity or effusion.   FINDINGS   Left Ventricle: Left ventricular ejection fraction, by estimation, is 55  to 60%.   Assessment and Plan:   70 year old woman with:  1.  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  Her disease status was updated at this time and treatment choices were discussed.  Before initiating any treatment at this time I would recommend updating her staging scans in the near future.  Treatment options for stage IV kidney cancer were discussed which include oral targeted therapy, immunotherapy or combination of the 2.  Risks and benefits of all these options were discussed at  this time.  Complication associated with oral targeted therapy to include nausea, fatigue, diarrhea and hypertension.  Complications related to immunotherapy were also discussed including autoimmune complications.  Given her potential colitis that developed after combination immunotherapy a favor a trial of oral targeted therapy with cabozantinib.  Complications were reiterated including diarrhea and GI toxicity.  Adding nivolumab could be a possibility pending her tolerance and response.  We will update her staging scans in the near future.   2.  Recurrent venous thromboembolism: She has homozygous factor V Leiden mutation with high risk of thrombosis.  She is currently on Eliquis.  3.  Renal failure: Continues to be dialysis dependent.  4.  Prognosis and goals of care: Her disease is incurable although can be palliated at this time.  Any treatment would be palliative in nature.  However, her performance status is adequate and pending staging scans and response to therapy her life  expectancy could be beyond a year.  5.  Aortic stenosis: Has been limiting her overall performance status and quality of life.  Pending her staging scans and response to therapy, it would be reasonable to consider repair or replacement in the future.  6.  Follow-up: Will be in the next 4 to 5 weeks to follow her progress.  60  minutes were dedicated to this visit. The time was spent on reviewing laboratory data, imaging studies, discussing treatment options, complications related to therapy and answering questions regarding future plan.       A copy of this consult has been forwarded to the requesting physician.

## 2021-03-27 NOTE — Telephone Encounter (Addendum)
Oral Oncology Pharmacist Encounter  Received new prescription for Cabometyx (cabozantinib) for the treatment of stage IV clear-cell renal cell cancer, planned duration until disease progression or unacceptable drug toxicity.  Prescription dose and frequency assessed for appropriateness. Appropriate for therapy initiation.   BMP and CBC from 03/20/21 assessed, patient with baseline ESRD, no renal dose adjustments for cabometyx required per package insert. Reviewed LFTs on CMP from 03/19/21 - WNL, no dose adjustments required at this time.  Current medication list in Epic reviewed, no relevant/significant DDIs with Cabometyx identified. Case report of Apixaban possibly increasing risk of side effects of Cabometyx, specifically thrombocytopenia. Will counsel patient to monitor for any increased s/sx of bleeding.   Evaluated chart and no patient barriers to medication adherence noted.   Patient agreement for treatment documented in MD note on 03/27/21.  Patient's insurance requires that prescription be filled through [employer]. Prescription redirected for dispensing.   Oral Oncology Clinic will continue to follow for insurance authorization, copayment issues, initial counseling and start date.  Leron Croak, PharmD, BCPS Hematology/Oncology Clinical Pharmacist [facility] [phone] 03/27/2021 2:51 PM

## 2021-03-27 NOTE — Telephone Encounter (Signed)
Oral Oncology Patient Advocate Encounter   Received notification from [employer] that prior authorization for Cabometyx is required.   PA submitted on CoverMyMeds Key [id number] Status is pending   Oral Oncology Clinic will continue to follow.  Sonya Beck Phone [phone] Fax [phone] 03/27/2021 3:13 PM

## 2021-03-31 NOTE — Telephone Encounter (Signed)
Oral Chemotherapy Pharmacist Encounter   Attempted to reach patient to provide update and offer for initial counseling on oral medication: Cabometyx (cabozantinib).   No answer. Left voicemail for patient to call back to discuss details of medication acquisition and initial counseling session.  Leron Croak, PharmD, BCPS Hematology/Oncology Clinical Pharmacist [facility] [phone] 03/31/2021 3:36 PM

## 2021-04-01 ENCOUNTER — Telehealth: Payer: Self-pay | Admitting: *Deleted

## 2021-04-01 NOTE — Telephone Encounter (Signed)
Oral Chemotherapy Pharmacist Encounter   Attempted to reach patient to provide update and offer for initial counseling on oral medication: Cabometyx (cabozantinib).   No answer. Left voicemail for patient to call back to discuss details of medication acquisition and initial counseling session.  Leron Croak, PharmD, BCPS Hematology/Oncology Clinical Pharmacist [facility] [phone] 04/01/2021 12:54 PM

## 2021-04-01 NOTE — Telephone Encounter (Signed)
Returned PC to patient, informed her that her CT is scheduled for 04/03/21 @ 0730.  Also informed her she needs to come pick up contrast at the [facility], she needs to drink the first bottle at 0530 on the 15th, second bottle at 0630.  She is to be NPO after 0330 that morning.  She is to arrive at [facility] 30-45 minutes before her scan.  Patient & her son verbalize understanding.

## 2021-04-02 NOTE — Telephone Encounter (Signed)
Oral Chemotherapy Pharmacist Encounter  I spoke with patient's son, Sonya Beck, for overview of: Cabometyx for the treatment of stage IV clear-cell renal cell carcinoma, planned duration until disease progression or unacceptable toxicity.   Counseled on administration, dosing, side effects, monitoring, drug-food interactions, safe handling, storage, and disposal.  Patient will take Cabometyx 40mg  tablets, 1 tablet (40mg ) by mouth once daily on an empty stomach, 1 hour before or 2 hours after a meal.  Patient's son knows patient needs to avoid grapefruit and grapefruit juice.  Cabometyx start date: patient will start once medication is received from [facility]. Phone number provided to son to set up shipment (tele: [phone])  Adverse effects include but are not limited to: diarrhea, nausea, decreased appetite, fatigue, hypertension, hand-foot syndrome, decreased blood counts, and electrolyte abnormalities.  Patient will obtain anti diarrheal and alert the office of 4 or more loose stools above baseline.  Patient's son informed that Cabometyx should be held at least 3 weeks prior to any scheduled surgery (including dental surgery) and not resumed until at least 2 weeks after major surgery and until adequate wound healing is established.  Reviewed importance of keeping a medication schedule and plan for any missed doses. No barriers to medication adherence identified.  Medication reconciliation performed and medication/allergy list updated.  Insurance authorization for Cabometyx has been obtained. Patient's insurance requires Cabometyx be filled through [employer]. Contact information for [facility] shared with patient's son.    All questions answered.  [name] voiced understanding and appreciation.   Medication education handout placed in mail for patient and son. Family knows to call the office with questions or concerns. Oral  Chemotherapy Clinic phone number provided.   Leron Croak, PharmD, BCPS Hematology/Oncology Clinical Pharmacist [facility] [phone] 04/02/2021 10:00 AM

## 2021-04-03 ENCOUNTER — Telehealth: Payer: Self-pay | Admitting: Hematology

## 2021-04-03 ENCOUNTER — Ambulatory Visit (HOSPITAL_COMMUNITY)
Admission: RE | Admit: 2021-04-03 | Discharge: 2021-04-03 | Disposition: A | Discharge status: Home / Self Care | Payer: Medicare Other | Source: Ambulatory Visit | Attending: Oncology | Admitting: Oncology

## 2021-04-03 ENCOUNTER — Encounter (HOSPITAL_COMMUNITY): Payer: Self-pay

## 2021-04-03 ENCOUNTER — Other Ambulatory Visit: Payer: Self-pay

## 2021-04-03 DIAGNOSIS — C787 Secondary malignant neoplasm of liver and intrahepatic bile duct: Secondary | ICD-10-CM | POA: Diagnosis present

## 2021-04-03 DIAGNOSIS — C649 Malignant neoplasm of unspecified kidney, except renal pelvis: Secondary | ICD-10-CM

## 2021-04-03 NOTE — Telephone Encounter (Signed)
Sch per 7/11 los, pt aware.

## 2021-04-06 ENCOUNTER — Emergency Department (HOSPITAL_COMMUNITY): Payer: Medicare Other

## 2021-04-06 ENCOUNTER — Encounter (HOSPITAL_COMMUNITY): Payer: Self-pay | Admitting: Emergency Medicine

## 2021-04-06 ENCOUNTER — Inpatient Hospital Stay (HOSPITAL_COMMUNITY)
Admission: EM | Admit: 2021-04-06 | Discharge: 2021-04-08 | DRG: 391 | Disposition: A | Discharge status: Home / Self Care | Payer: Medicare Other | Attending: Internal Medicine | Admitting: Internal Medicine

## 2021-04-06 ENCOUNTER — Other Ambulatory Visit: Payer: Self-pay

## 2021-04-06 ENCOUNTER — Observation Stay (HOSPITAL_COMMUNITY): Payer: Medicare Other

## 2021-04-06 ENCOUNTER — Telehealth: Payer: Self-pay | Admitting: *Deleted

## 2021-04-06 DIAGNOSIS — Z8249 Family history of ischemic heart disease and other diseases of the circulatory system: Secondary | ICD-10-CM

## 2021-04-06 DIAGNOSIS — D6851 Activated protein C resistance: Secondary | ICD-10-CM | POA: Diagnosis present

## 2021-04-06 DIAGNOSIS — Z86718 Personal history of other venous thrombosis and embolism: Secondary | ICD-10-CM

## 2021-04-06 DIAGNOSIS — C649 Malignant neoplasm of unspecified kidney, except renal pelvis: Secondary | ICD-10-CM | POA: Diagnosis present

## 2021-04-06 DIAGNOSIS — C787 Secondary malignant neoplasm of liver and intrahepatic bile duct: Secondary | ICD-10-CM | POA: Diagnosis present

## 2021-04-06 DIAGNOSIS — R1110 Vomiting, unspecified: Secondary | ICD-10-CM | POA: Diagnosis not present

## 2021-04-06 DIAGNOSIS — Z880 Allergy status to penicillin: Secondary | ICD-10-CM

## 2021-04-06 DIAGNOSIS — N186 End stage renal disease: Secondary | ICD-10-CM

## 2021-04-06 DIAGNOSIS — M797 Fibromyalgia: Secondary | ICD-10-CM | POA: Diagnosis present

## 2021-04-06 DIAGNOSIS — R112 Nausea with vomiting, unspecified: Secondary | ICD-10-CM | POA: Diagnosis not present

## 2021-04-06 DIAGNOSIS — Z888 Allergy status to other drugs, medicaments and biological substances status: Secondary | ICD-10-CM

## 2021-04-06 DIAGNOSIS — I350 Nonrheumatic aortic (valve) stenosis: Secondary | ICD-10-CM

## 2021-04-06 DIAGNOSIS — R1013 Epigastric pain: Secondary | ICD-10-CM | POA: Insufficient documentation

## 2021-04-06 DIAGNOSIS — E860 Dehydration: Secondary | ICD-10-CM | POA: Diagnosis present

## 2021-04-06 DIAGNOSIS — Z79899 Other long term (current) drug therapy: Secondary | ICD-10-CM

## 2021-04-06 DIAGNOSIS — K219 Gastro-esophageal reflux disease without esophagitis: Secondary | ICD-10-CM | POA: Diagnosis present

## 2021-04-06 DIAGNOSIS — Z8542 Personal history of malignant neoplasm of other parts of uterus: Secondary | ICD-10-CM

## 2021-04-06 DIAGNOSIS — Z7901 Long term (current) use of anticoagulants: Secondary | ICD-10-CM

## 2021-04-06 DIAGNOSIS — Z905 Acquired absence of kidney: Secondary | ICD-10-CM

## 2021-04-06 DIAGNOSIS — Z992 Dependence on renal dialysis: Secondary | ICD-10-CM

## 2021-04-06 DIAGNOSIS — Z20822 Contact with and (suspected) exposure to covid-19: Secondary | ICD-10-CM | POA: Diagnosis present

## 2021-04-06 DIAGNOSIS — I083 Combined rheumatic disorders of mitral, aortic and tricuspid valves: Secondary | ICD-10-CM | POA: Diagnosis present

## 2021-04-06 DIAGNOSIS — C801 Malignant (primary) neoplasm, unspecified: Secondary | ICD-10-CM

## 2021-04-06 DIAGNOSIS — Z87891 Personal history of nicotine dependence: Secondary | ICD-10-CM

## 2021-04-06 DIAGNOSIS — Z8673 Personal history of transient ischemic attack (TIA), and cerebral infarction without residual deficits: Secondary | ICD-10-CM

## 2021-04-06 DIAGNOSIS — Z85528 Personal history of other malignant neoplasm of kidney: Secondary | ICD-10-CM

## 2021-04-06 LAB — URINALYSIS, ROUTINE W REFLEX MICROSCOPIC
Bilirubin Urine: NEGATIVE
Glucose, UA: NEGATIVE mg/dL
Ketones, ur: NEGATIVE mg/dL
Nitrite: NEGATIVE
Protein, ur: 100 mg/dL — AB
Specific Gravity, Urine: 1.016 (ref 1.005–1.030)
pH: 6 (ref 5.0–8.0)

## 2021-04-06 LAB — CBC
HCT: 30.5 % — ABNORMAL LOW (ref 36.0–46.0)
Hemoglobin: 9.4 g/dL — ABNORMAL LOW (ref 12.0–15.0)
MCH: 30.5 pg (ref 26.0–34.0)
MCHC: 30.8 g/dL (ref 30.0–36.0)
MCV: 99 fL (ref 80.0–100.0)
Platelets: 236 10*3/uL (ref 150–400)
RBC: 3.08 MIL/uL — ABNORMAL LOW (ref 3.87–5.11)
RDW: 20.1 % — ABNORMAL HIGH (ref 11.5–15.5)
WBC: 7.8 10*3/uL (ref 4.0–10.5)
nRBC: 0 % (ref 0.0–0.2)

## 2021-04-06 LAB — COMPREHENSIVE METABOLIC PANEL
ALT: 55 U/L — ABNORMAL HIGH (ref 0–44)
AST: 77 U/L — ABNORMAL HIGH (ref 15–41)
Albumin: 3.4 g/dL — ABNORMAL LOW (ref 3.5–5.0)
Alkaline Phosphatase: 66 U/L (ref 38–126)
Anion gap: 19 — ABNORMAL HIGH (ref 5–15)
BUN: 38 mg/dL — ABNORMAL HIGH (ref 8–23)
CO2: 24 mmol/L (ref 22–32)
Calcium: 9.7 mg/dL (ref 8.9–10.3)
Chloride: 93 mmol/L — ABNORMAL LOW (ref 98–111)
Creatinine, Ser: 5.64 mg/dL — ABNORMAL HIGH (ref 0.44–1.00)
GFR, Estimated: 8 mL/min — ABNORMAL LOW (ref >60)
Glucose, Bld: 113 mg/dL — ABNORMAL HIGH (ref 70–99)
Potassium: 4.3 mmol/L (ref 3.5–5.1)
Sodium: 136 mmol/L (ref 135–145)
Total Bilirubin: 1.6 mg/dL — ABNORMAL HIGH (ref 0.3–1.2)
Total Protein: 7.1 g/dL (ref 6.5–8.1)

## 2021-04-06 LAB — LIPASE, BLOOD: Lipase: 36 U/L (ref 11–51)

## 2021-04-06 MED ORDER — PANTOPRAZOLE SODIUM 40 MG PO TBEC
40.0000 mg | DELAYED_RELEASE_TABLET | Freq: Every day | ORAL | Status: DC
  Administered 2021-04-06 – 2021-04-08 (×3): 40 mg via ORAL
  Filled 2021-04-06 (×3): qty 1

## 2021-04-06 MED ORDER — APIXABAN 5 MG PO TABS
5.0000 mg | ORAL_TABLET | Freq: Two times a day (BID) | ORAL | Status: DC
  Administered 2021-04-06 – 2021-04-08 (×4): 5 mg via ORAL
  Filled 2021-04-06 (×4): qty 1

## 2021-04-06 MED ORDER — PROMETHAZINE HCL 25 MG PO TABS
12.5000 mg | ORAL_TABLET | Freq: Four times a day (QID) | ORAL | Status: DC | PRN
  Administered 2021-04-07: 12.5 mg via ORAL
  Filled 2021-04-06: qty 1

## 2021-04-06 MED ORDER — ONDANSETRON HCL 4 MG/2ML IJ SOLN: 4.0000 mg | Freq: Four times a day (QID) | INTRAMUSCULAR | Status: DC | PRN

## 2021-04-06 MED ORDER — ACETAMINOPHEN 325 MG PO TABS: 650.0000 mg | ORAL_TABLET | Freq: Four times a day (QID) | ORAL | Status: DC | PRN

## 2021-04-06 MED ORDER — SODIUM CHLORIDE 0.9 % IV SOLN
6.2500 mg | Freq: Four times a day (QID) | INTRAVENOUS | Status: DC | PRN
  Filled 2021-04-06 (×3): qty 0.25

## 2021-04-06 MED ORDER — ONDANSETRON HCL 4 MG PO TABS: 4.0000 mg | ORAL_TABLET | Freq: Four times a day (QID) | ORAL | Status: DC | PRN

## 2021-04-06 MED ORDER — ACETAMINOPHEN 650 MG RE SUPP: 650.0000 mg | Freq: Four times a day (QID) | RECTAL | Status: DC | PRN

## 2021-04-06 MED ORDER — LACTATED RINGERS IV BOLUS
500.0000 mL | Freq: Once | INTRAVENOUS | Status: AC
  Administered 2021-04-06: 500 mL via INTRAVENOUS

## 2021-04-06 MED ORDER — PROMETHAZINE HCL 12.5 MG RE SUPP
12.5000 mg | Freq: Four times a day (QID) | RECTAL | Status: DC | PRN
  Filled 2021-04-06: qty 1

## 2021-04-06 MED ORDER — ONDANSETRON HCL 4 MG/2ML IJ SOLN
4.0000 mg | Freq: Once | INTRAMUSCULAR | Status: AC
  Administered 2021-04-06: 4 mg via INTRAVENOUS
  Filled 2021-04-06: qty 2

## 2021-04-06 NOTE — ED Notes (Signed)
Patient transported to X-ray 

## 2021-04-06 NOTE — Telephone Encounter (Signed)
Spoke with son.  He will bring patient to ED for further evaluation.

## 2021-04-06 NOTE — H&P (Signed)
History and Physical    Sonya Beck XAJ:[id number] DOB: 02-Jan-1951 DOA: 04/06/2021  PCP: Ronnell Freshwater, NP   Patient coming from: Home   Chief Complaint: Nausea, vomiting   HPI: Sonya Beck is a 70 y.o. 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 Patient reports that she drank 2 bottles of oral contrast for an outpatient CT on 04/03/2021, began to develop some nausea with the first bottle, and shortly after the second, had severe nausea with nonbloody vomiting.  Since that time, she has continued to have intermittent epigastric pain, nausea, and nonbloody vomiting.  She has been able to tolerate minimal oral intake at times.  She has been taking Zofran at home without any appreciable improvement.  She completed dialysis on 04/04/2021.  She denies fevers, chills, dysuria, or flank pain.  She has not yet started cabozantinib.  ED Course: Upon arrival to the ED, patient is found to be afebrile, saturating well on room air, and with stable blood pressure.  EKG features sinus rhythm with PACs.  Chest x-ray with stable cardiomegaly, decreased vascular congestion, and question of small pleural effusion.  Chemistry panel notable for potassium 4.3, normal bicarbonate, BUN 38, and mild elevation in transaminases and bilirubin.  Right upper quadrant ultrasound with no gallstones or biliary dilation, and with gallbladder wall thickening that appears to be chronic.  CBC with hemoglobin 9.4.  Patient was treated with antiemetics and IV fluids in the ED.  Review of Systems:  All other systems reviewed and apart from HPI, are negative.  Past Medical History:  Diagnosis Date   Cancer [facility])    Endometrial adenocarcinoma ([place]) 04/17/2018   Fibromyalgia 03/18/2021    Gastroesophageal reflux disease without esophagitis 03/18/2021   History of blood transfusion    History of DVT of lower extremity 03/18/2021   History of stroke 03/18/2021   Metastatic renal cell carcinoma to liver ([place]) 03/18/2021   Severe aortic stenosis 03/18/2021   Stroke [facility])     Past Surgical History:  Procedure Laterality Date   RENAL BIOPSY      Social History:   reports that she has quit smoking. Her smoking use included cigarettes. She has never used smokeless tobacco. She reports previous alcohol use. She reports that she does not use drugs.  Allergies  Allergen Reactions   Heparin Other (See Comments)    HITT   Penicillins Swelling    Swelling in mouth     Family History  Problem Relation Age of Onset   Heart disease Neg Hx      Prior to Admission medications   Medication Sig Start Date End Date Taking? Authorizing Provider  apixaban (ELIQUIS) 5 MG TABS tablet Take 1 tablet (5 mg total) by mouth 2 (two) times daily. 03/18/21   Ronnell Freshwater, NP  cabozantinib (CABOMETYX) 40 MG tablet Take 1 tablet (40 mg total) by mouth daily. Take on an empty stomach, 1 hour before or 2 hours after meals. 03/27/21   Wyatt Portela, MD  cetirizine (ZYRTEC) 5 MG tablet Take 5 mg by mouth daily as needed for allergies. Patient not taking: Reported on 03/27/2021    [provider]  midodrine (PROAMATINE) 10 MG tablet Take 1 tablet (10 mg total) by mouth Every Tuesday,Thursday,and Saturday with dialysis. Patient not taking: Reported on  03/27/2021 03/21/21   Regalado, Belkys A, MD  pantoprazole (PROTONIX) 40 MG tablet Take 1 tablet (40 mg total) by mouth daily. 03/18/21   Ronnell Freshwater, NP  vitamin B-12 1000 MCG tablet Take 1 tablet (1,000 mcg total) by mouth daily. Patient not taking: Reported on 03/27/2021 03/21/21   Elmarie Shiley, MD    Physical Exam: Vitals:   04/06/21 1258 04/06/21 2118 04/06/21 2119  BP: 127/72 135/71   Pulse: 76 80   Resp: 17 18   Temp: 98.4 F  (36.9 C) 98.4 F (36.9 C)   TempSrc: Oral    SpO2: 97% 98%   Weight:   83.1 kg  Height:   5\' 2"  (1.575 m)    Constitutional: NAD, calm  Eyes: PERTLA, lids and conjunctivae normal ENMT: Mucous membranes are moist. Posterior pharynx clear of any exudate or lesions.   Neck:  supple, no masses  Respiratory: no wheezing, no crackles. No accessory muscle use.  Cardiovascular: S1 & S2 heard, regular rate and rhythm. No extremity edema.   Abdomen: No distension, no tenderness, soft. Bowel sounds active.  Musculoskeletal: no clubbing / cyanosis. No joint deformity upper and lower extremities.   Skin: no significant rashes, lesions, ulcers. Warm, dry, well-perfused. Neurologic: CN 2-12 grossly intact. Sensation intact. Moving all extremities.  Psychiatric: Alert and oriented to person, place, and situation. Pleasant and cooperative.    Labs and Imaging on Admission: I have personally reviewed following labs and imaging studies  CBC: Recent Labs  Lab 04/06/21 1101  WBC 7.8  HGB 9.4*  HCT 30.5*  MCV 99.0  PLT 562   Basic Metabolic Panel: Recent Labs  Lab 04/06/21 1101  NA 136  K 4.3  CL 93*  CO2 24  GLUCOSE 113*  BUN 38*  CREATININE 5.64*  CALCIUM 9.7   GFR: Estimated Creatinine Clearance: 9 mL/min (A) (by C-G formula based on SCr of 5.64 mg/dL (H)). Liver Function Tests: Recent Labs  Lab 04/06/21 1101  AST 77*  ALT 55*  ALKPHOS 66  BILITOT 1.6*  PROT 7.1  ALBUMIN 3.4*   Recent Labs  Lab 04/06/21 1101  LIPASE 36   No results for input(s): AMMONIA in the last 168 hours. Coagulation Profile: No results for input(s): INR, PROTIME in the last 168 hours. Cardiac Enzymes: No results for input(s): CKTOTAL, CKMB, CKMBINDEX, TROPONINI in the last 168 hours. BNP (last 3 results) No results for input(s): PROBNP in the last 8760 hours. HbA1C: No results for input(s): HGBA1C in the last 72 hours. CBG: No results for input(s): GLUCAP in the last 168 hours. Lipid  Profile: No results for input(s): CHOL, HDL, LDLCALC, TRIG, CHOLHDL, LDLDIRECT in the last 72 hours. Thyroid Function Tests: No results for input(s): TSH, T4TOTAL, FREET4, T3FREE, THYROIDAB in the last 72 hours. Anemia Panel: No results for input(s): VITAMINB12, FOLATE, FERRITIN, TIBC, IRON, RETICCTPCT in the last 72 hours. Urine analysis:    Component Value Date/Time   COLORURINE AMBER (A) 04/06/2021 1101   APPEARANCEUR HAZY (A) 04/06/2021 1101   LABSPEC 1.016 04/06/2021 1101   PHURINE 6.0 04/06/2021 1101   GLUCOSEU NEGATIVE 04/06/2021 1101   HGBUR SMALL (A) 04/06/2021 1101   BILIRUBINUR NEGATIVE 04/06/2021 1101   KETONESUR NEGATIVE 04/06/2021 1101   PROTEINUR 100 (A) 04/06/2021 1101   NITRITE NEGATIVE 04/06/2021 1101   LEUKOCYTESUR MODERATE (A) 04/06/2021 1101   Sepsis Labs: @LABRCNTIP (procalcitonin:4,lacticidven:4) )No results found for this or any previous visit (from the past 240 hour(s)).   Radiological Exams on  Admission: DG Chest 1 View  Result Date: 04/06/2021 CLINICAL DATA:  Nausea and vomiting. EXAM: CHEST  1 VIEW COMPARISON:  Radiograph 03/18/2021.  CT 04/03/2021 FINDINGS: Improved lung volumes from prior exam. Cardiomegaly is not significantly changed. Stable mediastinal contours. Aortic atherosclerosis. Improved vascular congestion. Suspected small right pleural effusion. No acute airspace disease. No pneumothorax. No acute osseous abnormalities are seen IMPRESSION: 1. Stable cardiomegaly. Improved vascular congestion from prior exam. 2. Suspected small right pleural effusion. Electronically Signed   By: Keith Rake M.D.   On: 04/06/2021 18:35   US Abdomen Limited  Result Date: 04/06/2021 CLINICAL DATA:  Right upper quadrant and epigastric pain. Review of radiologic records states metastatic renal cancer to liver. EXAM: ULTRASOUND ABDOMEN LIMITED RIGHT UPPER QUADRANT COMPARISON:  Noncontrast CT 04/03/2021 FINDINGS: Gallbladder: Physiologically distended. Wall  thickening abutting the liver surface of 7-8 mm. No gallstones. No sonographic Murphy sign noted by sonographer. Common bile duct: Diameter: 1-2 mm, normal. Liver: Known liver lesions are not well seen on the current exam. Background normal parenchymal echogenicity. Portal vein is patent on color Doppler imaging with normal direction of blood flow towards the liver. Other: Small volume ascites in the right upper quadrant. Right pleural effusion. IMPRESSION: 1. No gallstones. Gallbladder-wall thickening of 7-8 mm, also seen on recent CT. This may be chronic, and is not favored to represent acute gallbladder inflammation. 2. No biliary dilatation. 3. Known liver lesions are not well seen on the current exam, may be technical. 4. Right pleural effusion and trace right upper quadrant ascites. Electronically Signed   By: Keith Rake M.D.   On: 04/06/2021 19:55    EKG: Independently reviewed. Sinus rhythm, PAC.   Assessment/Plan   1. Intractable N/V  - Presents with 4 days of N/V with intermittent epigastric pain and inability to tolerate adequate PO despite Zofran at home  - She was treated with IVF and antiemetics in ED but N/V persists  - She has not started cabozantinib yet  - Check KUB, continue supportive care, antiemetics, monitor electrolytes   2. Metastatic renal cell carcinoma  - S/p left nephrectomy in 2018, found to have liver metastases confirmed with FNA to be RCC, underwent 1 cycle of immunotherapy in [place] in early June  - She is established with local oncologist now, being considered for trial of cabozantinib but has not taken yet    3. ESRD  - Completed HD on 7/16 per pt report  - No indication for urgent HD on admission  - Renally-dose medications, restrict fluids    4. History of recurrent DVT - FVL with recurrent DVT  - No evidence for acute VTE, continue Eliquis    5. History of CVA  - Continue Eliquis   6. Severe AS  - Evaluated by cardiology for consideration of TAVR  but with incurable cancer, there was concern that she may not be candidate  - Continue medical mgmt, diuretics discontinued last month    DVT prophylaxis: Eliquis  Code Status: Full, confirmed with patient on admission  Level of Care: Level of care: Med-Surg Family Communication: None present  Disposition Plan:  Patient is from: Home  Anticipated d/c is to: TBD Anticipated d/c date is: 7/19 or 04/08/21 Patient currently: Pending KUB, tolerance of adequate oral intake Consults called: None  Admission status: Observation    Vianne Bulls, MD [facility]  04/06/2021, 9:57 PM

## 2021-04-06 NOTE — ED Notes (Signed)
Patient walked across the hallway without issues to use the restroom

## 2021-04-06 NOTE — ED Notes (Signed)
Sent to xray.

## 2021-04-06 NOTE — ED Provider Notes (Signed)
Emergency Medicine Provider Triage Evaluation Note  Sonya Beck , a 70 y.o. female  was evaluated in triage.  Pt complains of nausea and NBNB emesis for the past 4 days. Pt had a CT scan done on Friday for staging of her liver cancer. She reports she had "2 tubes of barium" that she had to drink for the CT scan and afterwards began vomiting. Pt states that she had had about 3 episodes of emesis and is now dry heaving. Has been taking Zofran at home with mild relief. Also having epigastrium abdominal pain.   Review of Systems  Positive: + epigastric abdominal pain, nausea, vomiting Negative: - diarrhea, fevers  Physical Exam  There were no vitals taken for this visit. Gen:   Awake, no distress   Resp:  Normal effort  MSK:   Moves extremities without difficulty  Other:  + diffuse upper abdominal TTP  Medical Decision Making  Medically screening exam initiated at 11:14 AM.  Appropriate orders placed.  Daisey Caloca was informed that the remainder of the evaluation will be completed by another provider, this initial triage assessment does not replace that evaluation, and the importance of remaining in the ED until their evaluation is complete.     Eustaquio Maize, PA-C 04/06/21 1114    Charlesetta Shanks, MD 04/16/21 [phone]

## 2021-04-06 NOTE — ED Triage Notes (Signed)
Pt arrives via [facility] for vomiting since Friday. Pt took some medication prior to a CT scan on Friday. (Unknown what the med is) Vomiting started shortly after and has continued.

## 2021-04-06 NOTE — ED Provider Notes (Signed)
[facility] EMERGENCY DEPARTMENT Provider Note   CSN: [id number] Arrival date & time: 04/06/21  1051     History Chief Complaint  Patient presents with   Vomiting    Sonya Beck is a 70 y.o. female.  HPI  70 year old female PMHx renal cell carcinoma (hepatic mets), endometrial adenocarcinoma, fibromyalgia, stroke, severe aortic stenosis, ESRD (HD TTS, make small amount of urine), presenting for multiple episodes NBNB vomiting x3 days.  Symptoms began after drinking x2 tubes of barium for CT scan last Friday for liver cancer staging.  Minimal relief with Zofran at home.  Associated symptoms include epigastric abdominal pain, moderate severity, nonradiating, stabbing quality, no obvious improving or worsening factors.  Further medical concern at this time point fevers, chills, sweats, sore throat, rhinorrhea, chest pain, shortness of breath, palpitations, cough, bowel/bladder changes, seizure, syncope.  History obtained from patient and chart review.    Past Medical History:  Diagnosis Date   Cancer [facility])    Endometrial adenocarcinoma ([place]) 04/17/2018   Fibromyalgia 03/18/2021   Gastroesophageal reflux disease without esophagitis 03/18/2021   History of blood transfusion    History of DVT of lower extremity 03/18/2021   History of stroke 03/18/2021   Metastatic renal cell carcinoma to liver ([place]) 03/18/2021   Severe aortic stenosis 03/18/2021   Stroke 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     Family History  Problem Relation Age of Onset   Heart disease Neg Hx     Social History   Tobacco Use   Smoking status: Former    Types: Cigarettes   Smokeless tobacco: Never  Vaping Use   Vaping Use: Never used  Substance Use Topics   Alcohol use: Not Currently   Drug use: Never    Home Medications Prior to Admission medications   Medication Sig Start Date End Date Taking? Authorizing Provider  apixaban (ELIQUIS) 5 MG TABS tablet Take 1 tablet (5 mg total) by mouth 2 (two) times daily. 03/18/21   Ronnell Freshwater, NP  cabozantinib (CABOMETYX) 40 MG tablet Take 1 tablet (40 mg total) by mouth daily. Take on an empty stomach, 1 hour before or 2 hours after meals. 03/27/21   Wyatt Portela, MD  cetirizine (ZYRTEC) 5 MG tablet Take 5 mg by mouth daily as needed for allergies. Patient not taking: Reported on 03/27/2021    [provider]  midodrine (PROAMATINE) 10 MG tablet Take 1 tablet (10 mg total) by mouth Every Tuesday,Thursday,and Saturday with dialysis. Patient not taking: Reported on 03/27/2021 03/21/21   Regalado, Jerald Kief A, MD  pantoprazole (PROTONIX) 40 MG tablet Take 1 tablet (40 mg total) by mouth daily. 03/18/21   Ronnell Freshwater, NP  vitamin  B-12 1000 MCG tablet Take 1 tablet (1,000 mcg total) by mouth daily. Patient not taking: Reported on 03/27/2021 03/21/21   Niel Hummer A, MD    Allergies    Heparin and Penicillins  Review of Systems   Review of Systems  All other systems reviewed and are negative.  Physical Exam Updated Vital Signs BP 128/68 (BP Location: Right Arm)   Pulse 75   Temp 98.2 F (36.8 C)   Resp 16   Ht 5\' 2"  (1.575 m)   Wt 83.1 kg   SpO2 98%   BMI 33.51 kg/m   Physical  Exam Vitals and nursing note reviewed.  Constitutional:      General: She is not in acute distress. HENT:     Head: Normocephalic and atraumatic.     Mouth/Throat:     Mouth: Mucous membranes are moist.     Pharynx: Oropharynx is clear.  Eyes:     Extraocular Movements: Extraocular movements intact.     Conjunctiva/sclera: Conjunctivae normal.  Cardiovascular:     Rate and Rhythm: Normal rate and regular rhythm.     Heart sounds: No murmur heard.   No friction rub. No gallop.  Pulmonary:     Effort: Pulmonary effort is normal.     Breath sounds: No stridor. Rales (r basilar) present. No wheezing or rhonchi.  Abdominal:     General: There is no distension.     Palpations: Abdomen is soft.     Tenderness: There is abdominal tenderness (epigastric). There is no guarding or rebound.  Musculoskeletal:        General: No deformity or signs of injury.     Cervical back: Normal range of motion and neck supple.  Skin:    General: Skin is warm and dry.  Neurological:     Mental Status: She is alert and oriented to person, place, and time. Mental status is at baseline.  Psychiatric:        Mood and Affect: Mood normal.        Behavior: Behavior normal.    ED Results / Procedures / Treatments   Labs (all labs ordered are listed, but only abnormal results are displayed) Labs Reviewed  COMPREHENSIVE METABOLIC PANEL - Abnormal; Notable for the following components:      Result Value   Chloride 93 (*)    Glucose, Bld 113 (*)    BUN 38 (*)    Creatinine, Ser 5.64 (*)    Albumin 3.4 (*)    AST 77 (*)    ALT 55 (*)    Total Bilirubin 1.6 (*)    GFR, Estimated 8 (*)    Anion gap 19 (*)    All other components within normal limits  CBC - Abnormal; Notable for the following components:   RBC 3.08 (*)    Hemoglobin 9.4 (*)    HCT 30.5 (*)    RDW 20.1 (*)    All other components within normal limits  URINALYSIS, ROUTINE W REFLEX MICROSCOPIC - Abnormal; Notable for the following  components:   Color, Urine AMBER (*)    APPearance HAZY (*)    Hgb urine dipstick SMALL (*)    Protein, ur 100 (*)    Leukocytes,Ua MODERATE (*)    Bacteria, UA MANY (*)    All other components within normal limits  LIPASE, BLOOD  COMPREHENSIVE METABOLIC PANEL  CBC    EKG None  Radiology DG Chest 1 View  Result Date: 04/06/2021 CLINICAL DATA:  Nausea and vomiting. EXAM: CHEST  1 VIEW COMPARISON:  Radiograph 03/18/2021.  CT 04/03/2021 FINDINGS: Improved lung volumes from prior exam. Cardiomegaly is not significantly changed. Stable mediastinal contours. Aortic atherosclerosis. Improved vascular congestion. Suspected small right pleural effusion. No acute airspace disease. No pneumothorax. No acute osseous abnormalities are seen IMPRESSION: 1. Stable cardiomegaly. Improved vascular congestion from prior exam. 2. Suspected small right pleural effusion. Electronically Signed   By: Keith Rake M.D.   On: 04/06/2021 18:35   US Abdomen Limited  Result Date: 04/06/2021 CLINICAL DATA:  Right upper quadrant and epigastric pain. Review of radiologic records states metastatic renal cancer to liver. EXAM: ULTRASOUND ABDOMEN LIMITED RIGHT UPPER QUADRANT COMPARISON:  Noncontrast CT 04/03/2021 FINDINGS: Gallbladder: Physiologically distended. Wall thickening abutting the liver surface of 7-8 mm. No gallstones. No sonographic Murphy sign noted by sonographer. Common bile duct: Diameter: 1-2 mm, normal. Liver: Known liver lesions are not well seen on the current exam. Background normal parenchymal echogenicity. Portal vein is patent on color Doppler imaging with normal direction of blood flow towards the liver. Other: Small volume ascites in the right upper quadrant. Right pleural effusion. IMPRESSION: 1. No gallstones. Gallbladder-wall thickening of 7-8 mm, also seen on recent CT. This may be chronic, and is not favored to represent acute gallbladder inflammation. 2. No biliary dilatation. 3. Known liver  lesions are not well seen on the current exam, may be technical. 4. Right pleural effusion and trace right upper quadrant ascites. Electronically Signed   By: Keith Rake M.D.   On: 04/06/2021 19:55   DG Abd Portable 1V  Result Date: 04/06/2021 CLINICAL DATA:  Nausea for 4 days, history of metastatic renal cell carcinoma EXAM: PORTABLE ABDOMEN - 1 VIEW COMPARISON:  04/03/2021 FINDINGS: Two supine frontal views of the abdomen and pelvis are obtained. The bowel gas pattern is unremarkable. Barium filled diverticular seen throughout the distal colon. No masses or abnormal calcifications. Extensive vascular calcifications are identified. No acute or destructive bony lesions. IMPRESSION: 1. Unremarkable bowel gas pattern. Electronically Signed   By: Randa Ngo M.D.   On: 04/06/2021 22:55    Procedures Procedures   Medications Ordered in ED Medications  pantoprazole (PROTONIX) EC tablet 40 mg (40 mg Oral Given 04/06/21 2330)  apixaban (ELIQUIS) tablet 5 mg (5 mg Oral Given 04/06/21 2331)  acetaminophen (TYLENOL) tablet 650 mg (has no administration in time range)    Or  acetaminophen (TYLENOL) suppository 650 mg (has no administration in time range)  ondansetron (ZOFRAN) tablet 4 mg (has no administration in time range)    Or  ondansetron (ZOFRAN) injection 4 mg (has no administration in time range)  promethazine (PHENERGAN) tablet 12.5 mg (12.5 mg Oral Given 04/07/21 0002)    Or  promethazine (PHENERGAN) 6.25 mg in sodium chloride 0.9 % 50 mL IVPB ( Intravenous See Alternative 04/07/21 0002)    Or  promethazine (PHENERGAN) suppository 12.5 mg ( Rectal See Alternative 04/07/21 0002)  ondansetron (ZOFRAN) injection 4 mg (4 mg Intravenous Given 04/06/21 1841)  lactated ringers bolus 500 mL (0 mLs Intravenous Stopped 04/06/21 2213)    ED Course  I have reviewed the triage vital signs and the nursing notes.  Pertinent labs & imaging results that were available during my care of the patient  were reviewed by me and considered in my medical decision making (see chart for details).    MDM Rules/Calculators/A&P  This is a 70 year old female PMHx ESRD, renal cell carcinoma with hepatic mets, presenting for N/V x3  days after drinking barium contrast for CT scan to evaluate stage of hepatic mets.  On exam, she is AF, HDS with some right basilar rales and epigastric tenderness to palpation.  Initial interventions: IV Zofran administered with some improvement of her nausea  DDx included: Viral illness, gastritis, pancreatitis, hepatobiliary disease, malignancy, obstruction, volvulus, appendicitis, dehydration, metabolic derangement  All studies independently reviewed by myself, d/w the attending physician, factored into my MDM. -EKG: NSR, 80 bpm, slight right axis deviation, normal intervals, TWI lead III, no ST/T changes; essentially unchanged compared to prior from 03/19/2021 -CMP: BUN 38 (21), creatinine 5.64 (5.05), bilirubin 1.6 (0.9), AG 19 (12) -CBCd: Hgb 9.4 (7.9) -CT chest, abdomen, pelvis from 7/15: Multiple hepatic lesions but normal gallbladder without intra or extrahepatic ductal dilation, no other significant intra-abdominal abnormalities -CXR: Stable cardiomegaly with improved vascular congestion from prior, small right pleural effusion evidenced on CT -RUQ ultrasound with distended gallbladder, wall thickening 7 to 8 mm but no gallstones or sonographic Murphy sign; likely chronic -UA: Pyuria, nitrate negative -Unremarkable: Lipase  Presentation appears most consistent with N/V and epigastric pain, uncertain etiology, possibly 2/2 metastatic disease.  Afebrile, unlikely viral illness.  Less likely gastritis, no heartburn symptoms.  Unremarkable lipase.  No evidence of obstruction, continue to pass flatus, nondistended.  Benign serial abdominal exams.  She does appear mildly dehydrated, but does not display any significant metabolic derangement.  Given patient's multiple  episodes of vomiting in the context of her ESRD, feel that she requested with overnight observation and careful rehydration.  Discussed with hospitalist service, who agreed to admit the patient.  Discussed plan with patient and son, understand and agree.  Patient HDS, nontoxic-appearing on reevaluation, subsequently admitted.  Final Clinical Impression(s) / ED Diagnoses Final diagnoses:  Nausea and vomiting, intractability of vomiting not specified, unspecified vomiting type  Epigastric pain    Rx / DC Orders ED Discharge Orders     None        Levin Bacon, MD 04/07/21 1941    Elnora Morrison, MD 04/08/21 [phone]

## 2021-04-06 NOTE — Telephone Encounter (Signed)
Patients son called to report concern for mother.  States she had CT Ab/Pelvis/Chest on Friday.  She had oral contrast only.  States as soon as she started drinking oral contrast she started gagging and vomiting.   States she has been nauseated all weekend long with persistent vomiting.  Only able to take down approximately 48 oz of fluid but vomits when trying to drink.    Son gave her some zofran they had but she vomited 20 minutes after taking.  He is concerned about if he should take her to ED or if he can treat her at home with a recommendation from the doctor.  He states he may have to call EMS to transport her to ED if he has to take her out of the home.  No prior allergies to oral contrast when had before.    Routed to Dr Alen Blew to please advise.

## 2021-04-07 ENCOUNTER — Other Ambulatory Visit (HOSPITAL_COMMUNITY): Payer: Self-pay

## 2021-04-07 ENCOUNTER — Observation Stay: Payer: Self-pay

## 2021-04-07 DIAGNOSIS — M797 Fibromyalgia: Secondary | ICD-10-CM | POA: Diagnosis present

## 2021-04-07 DIAGNOSIS — R112 Nausea with vomiting, unspecified: Secondary | ICD-10-CM | POA: Diagnosis present

## 2021-04-07 DIAGNOSIS — Z7901 Long term (current) use of anticoagulants: Secondary | ICD-10-CM | POA: Diagnosis not present

## 2021-04-07 DIAGNOSIS — Z8673 Personal history of transient ischemic attack (TIA), and cerebral infarction without residual deficits: Secondary | ICD-10-CM | POA: Diagnosis not present

## 2021-04-07 DIAGNOSIS — C787 Secondary malignant neoplasm of liver and intrahepatic bile duct: Secondary | ICD-10-CM | POA: Diagnosis present

## 2021-04-07 DIAGNOSIS — Z20822 Contact with and (suspected) exposure to covid-19: Secondary | ICD-10-CM | POA: Diagnosis present

## 2021-04-07 DIAGNOSIS — R1110 Vomiting, unspecified: Secondary | ICD-10-CM | POA: Diagnosis present

## 2021-04-07 DIAGNOSIS — Z79899 Other long term (current) drug therapy: Secondary | ICD-10-CM | POA: Diagnosis not present

## 2021-04-07 DIAGNOSIS — K219 Gastro-esophageal reflux disease without esophagitis: Secondary | ICD-10-CM | POA: Diagnosis present

## 2021-04-07 DIAGNOSIS — Z888 Allergy status to other drugs, medicaments and biological substances status: Secondary | ICD-10-CM | POA: Diagnosis not present

## 2021-04-07 DIAGNOSIS — Z880 Allergy status to penicillin: Secondary | ICD-10-CM | POA: Diagnosis not present

## 2021-04-07 DIAGNOSIS — Z85528 Personal history of other malignant neoplasm of kidney: Secondary | ICD-10-CM | POA: Diagnosis not present

## 2021-04-07 DIAGNOSIS — Z905 Acquired absence of kidney: Secondary | ICD-10-CM | POA: Diagnosis not present

## 2021-04-07 DIAGNOSIS — Z86718 Personal history of other venous thrombosis and embolism: Secondary | ICD-10-CM | POA: Diagnosis not present

## 2021-04-07 DIAGNOSIS — Z87891 Personal history of nicotine dependence: Secondary | ICD-10-CM | POA: Diagnosis not present

## 2021-04-07 DIAGNOSIS — D6851 Activated protein C resistance: Secondary | ICD-10-CM | POA: Diagnosis present

## 2021-04-07 DIAGNOSIS — N186 End stage renal disease: Secondary | ICD-10-CM | POA: Diagnosis present

## 2021-04-07 DIAGNOSIS — Z8542 Personal history of malignant neoplasm of other parts of uterus: Secondary | ICD-10-CM | POA: Diagnosis not present

## 2021-04-07 DIAGNOSIS — Z8249 Family history of ischemic heart disease and other diseases of the circulatory system: Secondary | ICD-10-CM | POA: Diagnosis not present

## 2021-04-07 DIAGNOSIS — Z992 Dependence on renal dialysis: Secondary | ICD-10-CM | POA: Diagnosis not present

## 2021-04-07 DIAGNOSIS — E860 Dehydration: Secondary | ICD-10-CM | POA: Diagnosis present

## 2021-04-07 DIAGNOSIS — I083 Combined rheumatic disorders of mitral, aortic and tricuspid valves: Secondary | ICD-10-CM | POA: Diagnosis present

## 2021-04-07 LAB — CBC
HCT: 29.1 % — ABNORMAL LOW (ref 36.0–46.0)
Hemoglobin: 9 g/dL — ABNORMAL LOW (ref 12.0–15.0)
MCH: 31.1 pg (ref 26.0–34.0)
MCHC: 30.9 g/dL (ref 30.0–36.0)
MCV: 100.7 fL — ABNORMAL HIGH (ref 80.0–100.0)
Platelets: 208 10*3/uL (ref 150–400)
RBC: 2.89 MIL/uL — ABNORMAL LOW (ref 3.87–5.11)
RDW: 20.5 % — ABNORMAL HIGH (ref 11.5–15.5)
WBC: 8.1 10*3/uL (ref 4.0–10.5)
nRBC: 0.4 % — ABNORMAL HIGH (ref 0.0–0.2)

## 2021-04-07 LAB — COMPREHENSIVE METABOLIC PANEL
ALT: 52 U/L — ABNORMAL HIGH (ref 0–44)
AST: 60 U/L — ABNORMAL HIGH (ref 15–41)
Albumin: 3.3 g/dL — ABNORMAL LOW (ref 3.5–5.0)
Alkaline Phosphatase: 76 U/L (ref 38–126)
Anion gap: 17 — ABNORMAL HIGH (ref 5–15)
BUN: 46 mg/dL — ABNORMAL HIGH (ref 8–23)
CO2: 24 mmol/L (ref 22–32)
Calcium: 9.9 mg/dL (ref 8.9–10.3)
Chloride: 95 mmol/L — ABNORMAL LOW (ref 98–111)
Creatinine, Ser: 6.15 mg/dL — ABNORMAL HIGH (ref 0.44–1.00)
GFR, Estimated: 7 mL/min — ABNORMAL LOW (ref >60)
Glucose, Bld: 97 mg/dL (ref 70–99)
Potassium: 4.3 mmol/L (ref 3.5–5.1)
Sodium: 136 mmol/L (ref 135–145)
Total Bilirubin: 1.4 mg/dL — ABNORMAL HIGH (ref 0.3–1.2)
Total Protein: 6.6 g/dL (ref 6.5–8.1)

## 2021-04-07 LAB — SARS CORONAVIRUS 2 (TAT 6-24 HRS): SARS Coronavirus 2: NEGATIVE

## 2021-04-07 MED ORDER — PENTAFLUOROPROP-TETRAFLUOROETH EX AERO: 1.0000 "application " | INHALATION_SPRAY | CUTANEOUS | Status: DC | PRN

## 2021-04-07 MED ORDER — LIDOCAINE-PRILOCAINE 2.5-2.5 % EX CREA: 1.0000 "application " | TOPICAL_CREAM | CUTANEOUS | Status: DC | PRN

## 2021-04-07 MED ORDER — SODIUM CHLORIDE 0.9 % IV SOLN: 100.0000 mL | INTRAVENOUS | Status: DC | PRN

## 2021-04-07 MED ORDER — SODIUM CHLORIDE 0.9 % IV SOLN
100.0000 mL | INTRAVENOUS | Status: DC | PRN
Start: 2021-04-07 — End: 2021-04-07

## 2021-04-07 MED ORDER — CHLORHEXIDINE GLUCONATE CLOTH 2 % EX PADS
6.0000 | MEDICATED_PAD | Freq: Every day | CUTANEOUS | Status: DC
  Administered 2021-04-07 – 2021-04-08 (×2): 6 via TOPICAL

## 2021-04-07 MED ORDER — LIDOCAINE HCL (PF) 1 % IJ SOLN
5.0000 mL | INTRAMUSCULAR | Status: DC | PRN
  Filled 2021-04-07: qty 5

## 2021-04-07 MED ORDER — DIPHENHYDRAMINE HCL 25 MG PO CAPS
25.0000 mg | ORAL_CAPSULE | Freq: Once | ORAL | Status: AC
  Administered 2021-04-07: 25 mg via ORAL
  Filled 2021-04-07: qty 1

## 2021-04-07 NOTE — Procedures (Signed)
I was present at this dialysis session. I have reviewed the session itself and made appropriate changes.   Vital signs in last 24 hours:  Temp:  [97.8 F (36.6 C)-98.4 F (36.9 C)] 98.1 F (36.7 C) (07/19 1125) Pulse Rate:  [75-80] 76 (07/19 1125) Resp:  [15-18] 16 (07/19 1330) BP: (89-135)/(29-73) 104/51 (07/19 1400) SpO2:  [90 %-98 %] 94 % (07/19 1125) Weight:  [83.1 kg-85.2 kg] 85.2 kg (07/19 1125) Weight change:  Filed Weights   04/06/21 2119 04/07/21 1125  Weight: 83.1 kg 85.2 kg    Recent Labs  Lab 04/07/21 0354  NA 136  K 4.3  CL 95*  CO2 24  GLUCOSE 97  BUN 46*  CREATININE 6.15*  CALCIUM 9.9    Recent Labs  Lab 04/06/21 1101 04/07/21 0354  WBC 7.8 8.1  HGB 9.4* 9.0*  HCT 30.5* 29.1*  MCV 99.0 100.7*  PLT 236 208    Scheduled Meds:  apixaban  5 mg Oral BID   Chlorhexidine Gluconate Cloth  6 each Topical Q0600   pantoprazole  40 mg Oral Daily   Continuous Infusions:  sodium chloride     sodium chloride     promethazine (PHENERGAN) injection (IM or IVPB)     PRN Meds:.sodium chloride, sodium chloride, acetaminophen **OR** acetaminophen, lidocaine (PF), lidocaine-prilocaine, ondansetron **OR** ondansetron (ZOFRAN) IV, pentafluoroprop-tetrafluoroeth, promethazine **OR** promethazine (PHENERGAN) injection (IM or IVPB) **OR** promethazine   Donetta Potts,  MD 04/07/2021, 2:37 PM

## 2021-04-07 NOTE — Progress Notes (Signed)
PROGRESS NOTE    Sonya Beck  XTG:[id number] DOB: 1951-09-15 DOA: 04/06/2021 PCP: Ronnell Freshwater, NP   Brief Narrative:  Sonya Beck is a 70 y.o. 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 Patient reports that she drank 2 bottles of oral contrast for an outpatient CT on 04/03/2021, began to develop some nausea with the first bottle, and shortly after the second, had severe nausea with nonbloody vomiting.  Since that time, she has continued to have intermittent epigastric pain, nausea, and nonbloody vomiting.  She has been able to tolerate minimal oral intake at times.  She has been taking Zofran at home without any appreciable improvement.  She completed dialysis on 04/04/2021.  She denies fevers, chills, dysuria, or flank pain.  She has not yet started cabozantinib.   Assessment & Plan:   Principal Problem:   Intractable nausea and vomiting Active Problems:   Severe aortic stenosis   History of DVT of lower extremity   Metastatic renal cell carcinoma to liver [facility])   History of stroke   ESRD on hemodialysis ([place])  Acute onset intractable N/V, minimally improving - Presents with 4 days of N/V with intermittent epigastric pain and inability to tolerate adequate PO despite Zofran at home  -Improved slightly with IV Zofran and IV fluids overnight - She has not started cabozantinib yet   Metastatic renal cell carcinoma  - S/p left nephrectomy in 2018, found to have liver metastases confirmed with FNA to be RCC, underwent 1 cycle of immunotherapy in [place] in early June  - She is established with local oncologist now, being considered for trial of cabozantinib but has not taken yet     ESRD Tuesday Thursday Saturday -Nephrology following, plan for  dialysis later today   History of recurrent DVT - FVL with recurrent DVT  - No evidence for acute VTE, continue Eliquis     History of CVA  - Continue Eliquis   Severe AS - Evaluated by cardiology for consideration of TAVR but with incurable cancer, there was concern that she may not be candidate - Continue medical mgmt, diuretics discontinued last month      DVT prophylaxis: Eliquis  Code Status: Full, confirmed with patient on admission   Status is: Inpatient  Dispo: The patient is from: Home              Anticipated d/c is to: Home              Anticipated d/c date is: 24 to 48 hours              Patient currently not medically stable for discharge  Consultants:  Nephrology  Procedures:  None  Antimicrobials:  None indicated  Subjective: Intractable nausea vomiting moderately improved since admission but still unable to tolerate p.o. appropriately.  Denies chest pain shortness of breath diarrhea constipation headache fevers or chills.  Objective: Vitals:   04/06/21 1258 04/06/21 2118 04/06/21 2119 04/07/21 0050  BP: 127/72 135/71  128/68  Pulse: 76 80  75  Resp: 17 18  16   Temp: 98.4 F (36.9 C) 98.4 F (36.9 C)  98.2 F (36.8 C)  TempSrc: Oral     SpO2: 97% 98%  98%  Weight:   83.1 kg   Height:   5\' 2"  (1.575  m)    No intake or output data in the 24 hours ending 04/07/21 0802 Filed Weights   04/06/21 2119  Weight: 83.1 kg    Examination:  General:  Pleasantly resting in bed, No acute distress. HEENT:  Normocephalic atraumatic.  Sclerae nonicteric, noninjected.  Extraocular movements intact bilaterally. Neck:  Without mass or deformity.  Trachea is midline. Lungs:  Clear to auscultate bilaterally without rhonchi, wheeze, or rales. Heart:  Regular rate and rhythm.  Without murmurs, rubs, or gallops. Abdomen:  Soft, nontender, nondistended.  Without guarding or rebound. Extremities: Without cyanosis, clubbing, edema, or obvious deformity. Vascular:   Dorsalis pedis and posterior tibial pulses palpable bilaterally. Skin:  Warm and dry, no erythema, no ulcerations.  Data Reviewed: I have personally reviewed following labs and imaging studies  CBC: Recent Labs  Lab 04/06/21 1101 04/07/21 0354  WBC 7.8 8.1  HGB 9.4* 9.0*  HCT 30.5* 29.1*  MCV 99.0 100.7*  PLT 236 161   Basic Metabolic Panel: Recent Labs  Lab 04/06/21 1101 04/07/21 0354  NA 136 136  K 4.3 4.3  CL 93* 95*  CO2 24 24  GLUCOSE 113* 97  BUN 38* 46*  CREATININE 5.64* 6.15*  CALCIUM 9.7 9.9   GFR: Estimated Creatinine Clearance: 8.3 mL/min (A) (by C-G formula based on SCr of 6.15 mg/dL (H)). Liver Function Tests: Recent Labs  Lab 04/06/21 1101 04/07/21 0354  AST 77* 60*  ALT 55* 52*  ALKPHOS 66 76  BILITOT 1.6* 1.4*  PROT 7.1 6.6  ALBUMIN 3.4* 3.3*   Recent Labs  Lab 04/06/21 1101  LIPASE 36   No results for input(s): AMMONIA in the last 168 hours. Coagulation Profile: No results for input(s): INR, PROTIME in the last 168 hours. Cardiac Enzymes: No results for input(s): CKTOTAL, CKMB, CKMBINDEX, TROPONINI in the last 168 hours. BNP (last 3 results) No results for input(s): PROBNP in the last 8760 hours. HbA1C: No results for input(s): HGBA1C in the last 72 hours. CBG: No results for input(s): GLUCAP in the last 168 hours. Lipid Profile: No results for input(s): CHOL, HDL, LDLCALC, TRIG, CHOLHDL, LDLDIRECT in the last 72 hours. Thyroid Function Tests: No results for input(s): TSH, T4TOTAL, FREET4, T3FREE, THYROIDAB in the last 72 hours. Anemia Panel: No results for input(s): VITAMINB12, FOLATE, FERRITIN, TIBC, IRON, RETICCTPCT in the last 72 hours. Sepsis Labs: No results for input(s): PROCALCITON, LATICACIDVEN in the last 168 hours.  No results found for this or any previous visit (from the past 240 hour(s)).       Radiology Studies: DG Chest 1 View  Result Date: 04/06/2021 CLINICAL DATA:  Nausea and vomiting. EXAM: CHEST  1 VIEW  COMPARISON:  Radiograph 03/18/2021.  CT 04/03/2021 FINDINGS: Improved lung volumes from prior exam. Cardiomegaly is not significantly changed. Stable mediastinal contours. Aortic atherosclerosis. Improved vascular congestion. Suspected small right pleural effusion. No acute airspace disease. No pneumothorax. No acute osseous abnormalities are seen IMPRESSION: 1. Stable cardiomegaly. Improved vascular congestion from prior exam. 2. Suspected small right pleural effusion. Electronically Signed   By: Keith Rake M.D.   On: 04/06/2021 18:35   US Abdomen Limited  Result Date: 04/06/2021 CLINICAL DATA:  Right upper quadrant and epigastric pain. Review of radiologic records states metastatic renal cancer to liver. EXAM: ULTRASOUND ABDOMEN LIMITED RIGHT UPPER QUADRANT COMPARISON:  Noncontrast CT 04/03/2021 FINDINGS: Gallbladder: Physiologically distended. Wall thickening abutting the liver surface of 7-8 mm. No gallstones. No sonographic Murphy sign noted by sonographer. Common bile duct: Diameter: 1-2  mm, normal. Liver: Known liver lesions are not well seen on the current exam. Background normal parenchymal echogenicity. Portal vein is patent on color Doppler imaging with normal direction of blood flow towards the liver. Other: Small volume ascites in the right upper quadrant. Right pleural effusion. IMPRESSION: 1. No gallstones. Gallbladder-wall thickening of 7-8 mm, also seen on recent CT. This may be chronic, and is not favored to represent acute gallbladder inflammation. 2. No biliary dilatation. 3. Known liver lesions are not well seen on the current exam, may be technical. 4. Right pleural effusion and trace right upper quadrant ascites. Electronically Signed   By: Keith Rake M.D.   On: 04/06/2021 19:55   DG Abd Portable 1V  Result Date: 04/06/2021 CLINICAL DATA:  Nausea for 4 days, history of metastatic renal cell carcinoma EXAM: PORTABLE ABDOMEN - 1 VIEW COMPARISON:  04/03/2021 FINDINGS: Two  supine frontal views of the abdomen and pelvis are obtained. The bowel gas pattern is unremarkable. Barium filled diverticular seen throughout the distal colon. No masses or abnormal calcifications. Extensive vascular calcifications are identified. No acute or destructive bony lesions. IMPRESSION: 1. Unremarkable bowel gas pattern. Electronically Signed   By: Randa Ngo M.D.   On: 04/06/2021 22:55     Scheduled Meds:  apixaban  5 mg Oral BID   pantoprazole  40 mg Oral Daily   Continuous Infusions:  promethazine (PHENERGAN) injection (IM or IVPB)       LOS: 0 days   Time spent: 20min  Whitt Auletta C Kade Rickels, DO [facility]  If 7PM-7AM, please contact night-coverage [email or web address]  04/07/2021, 8:02 AM

## 2021-04-07 NOTE — Progress Notes (Signed)
Pt with c/o generalized and requesting epinephrine for itching. MD made aware. Order given for benadryl.

## 2021-04-07 NOTE — Progress Notes (Signed)
Nephrology Quick Note:  S: Ms. Mccaul was admitted OBSERVATION status for intractable N/V, possibly triggered by PO contrast for OP CT. No improvement with PO Zofran at home, but better here after IV Zofran. Feels much better today. Due for dialysis today, per usual schedule (TTS).  O: Blood pressure 117/73, pulse 75, temperature 97.8 F (36.6 C), temperature source Oral, resp. rate 17, height 5\' 2"  (1.575 m), weight 83.1 kg, SpO2 94 %.  Exam: GEN:  Well appearing, NAD CV: RRR; 3/6 murmur PULM: CTAB EXTREM: No LE edema ACCESS: L AVF + thrill  Reviewed labs and studies.  HD orders: TTS at [facility] 3:30hr, 350/600, EDW 85kg, 2K/2Ca, AVF, no heparin, Hectoral 80mcg IV q HD  A/P: ESRD: Will dialyze today, per usual TTS schedule Intractable N/V: Better today, per primary. Metastatic RCC to liver   Veneta Penton, PA-C [facility] Pager [phone]

## 2021-04-07 NOTE — Progress Notes (Signed)
Hemodialysis- Patient chose to end treatment with [address] Marval Regal at bedside and aware. UF 1.3L due to early sign off. Patient stated itching but denied medication. When asked further just tells me "I just gotta get out of here." All blood returned per protocol. Report called to primary RN.

## 2021-04-08 DIAGNOSIS — R112 Nausea with vomiting, unspecified: Secondary | ICD-10-CM | POA: Diagnosis not present

## 2021-04-08 LAB — BASIC METABOLIC PANEL
Anion gap: 14 (ref 5–15)
BUN: 26 mg/dL — ABNORMAL HIGH (ref 8–23)
CO2: 26 mmol/L (ref 22–32)
Calcium: 8.9 mg/dL (ref 8.9–10.3)
Chloride: 94 mmol/L — ABNORMAL LOW (ref 98–111)
Creatinine, Ser: 4.16 mg/dL — ABNORMAL HIGH (ref 0.44–1.00)
GFR, Estimated: 11 mL/min — ABNORMAL LOW (ref >60)
Glucose, Bld: 85 mg/dL (ref 70–99)
Potassium: 3.4 mmol/L — ABNORMAL LOW (ref 3.5–5.1)
Sodium: 134 mmol/L — ABNORMAL LOW (ref 135–145)

## 2021-04-08 LAB — CBC
HCT: 27.3 % — ABNORMAL LOW (ref 36.0–46.0)
Hemoglobin: 8.4 g/dL — ABNORMAL LOW (ref 12.0–15.0)
MCH: 30.3 pg (ref 26.0–34.0)
MCHC: 30.8 g/dL (ref 30.0–36.0)
MCV: 98.6 fL (ref 80.0–100.0)
Platelets: 178 10*3/uL (ref 150–400)
RBC: 2.77 MIL/uL — ABNORMAL LOW (ref 3.87–5.11)
RDW: 20.3 % — ABNORMAL HIGH (ref 11.5–15.5)
WBC: 6.1 10*3/uL (ref 4.0–10.5)
nRBC: 0.5 % — ABNORMAL HIGH (ref 0.0–0.2)

## 2021-04-08 MED ORDER — ONDANSETRON HCL 4 MG PO TABS: 4.0000 mg | ORAL_TABLET | Freq: Four times a day (QID) | ORAL | 0 refills | Status: DC | PRN

## 2021-04-08 MED ORDER — PROMETHAZINE HCL 12.5 MG RE SUPP: 12.5000 mg | Freq: Four times a day (QID) | RECTAL | 0 refills | Status: DC | PRN

## 2021-04-08 NOTE — Discharge Summary (Signed)
Physician Discharge Summary  Sonya Beck WFU:[id number] DOB: 1951-06-16 DOA: 04/06/2021  PCP: Ronnell Freshwater, NP  Admit date: 04/06/2021 Discharge date: 04/08/2021  Admitted From: Home Disposition: Home  Recommendations for Outpatient Follow-up:  Follow up with PCP in 1-2 weeks Please obtain BMP/CBC in one week  Discharge Condition: Stable CODE STATUS: Full Diet recommendation: As tolerated  Brief/Interim Summary: Sonya Beck is a 70 y.o. 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 Patient reports that she drank 2 bottles of oral contrast for an outpatient CT on 04/03/2021, began to develop some nausea with the first bottle, and shortly after the second, had severe nausea with nonbloody vomiting.  Since that time, she has continued to have intermittent epigastric pain, nausea, and nonbloody vomiting.  She has been able to tolerate minimal oral intake at times.  She has been taking Zofran at home without any appreciable improvement.  She completed dialysis on 04/04/2021.  She denies fevers, chills, dysuria, or flank pain.  She has not yet started cabozantinib.   Patient admitted as above with acute intractable nausea vomiting and diarrhea of unclear etiology, cannot rule out infectious process although less likely given no leukocytosis or sick contacts.  Patient's dehydration and poor p.o. intake improved drastically over the past 24 hours with supplemental fluids, bland diet now advancing back to regular renal diet.  She is otherwise stable and agreeable for discharge home.  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 (HCC)   History of stroke   ESRD on hemodialysis (HCC)   Intractable vomiting with nausea, unspecified vomiting type    Discharge Instructions  Discharge Instructions     Call MD for:  redness, tenderness, or signs of infection (pain, swelling, redness, odor or green/yellow discharge around incision site)   Complete by: As directed    Call MD for:  severe uncontrolled pain   Complete by: As directed    Call MD for:  temperature >100.4   Complete by: As directed    Diet - low sodium heart healthy   Complete by: As directed    Increase activity slowly   Complete by: As directed       Allergies as of 04/08/2021       Reactions   Heparin Other (See Comments)   HITT   Penicillins Swelling   Swelling in mouth        Medication List     STOP taking these medications    Cabometyx 40 MG tablet Generic drug: cabozantinib   cetirizine 5 MG tablet Commonly known as: ZYRTEC   cyanocobalamin 1000 MCG tablet   midodrine 10 MG tablet Commonly known as: PROAMATINE       TAKE these medications    apixaban 5 MG Tabs tablet Commonly known as: Eliquis Take 1 tablet (5 mg total) by mouth 2 (two) times daily.   ondansetron 4 MG tablet Commonly known as: ZOFRAN Take 1 tablet (4 mg total) by mouth every 6 (six) hours as needed for nausea.   pantoprazole 40 MG tablet Commonly known as: PROTONIX Take 1 tablet (40 mg total) by mouth daily.   promethazine 12.5 MG suppository  Commonly known as: PHENERGAN Place 1 suppository (12.5 mg total) rectally every 6 (six) hours as needed for refractory nausea / vomiting.        Allergies  Allergen Reactions   Heparin Other (See Comments)    HITT   Penicillins Swelling    Swelling in mouth     Consultations: Nephrology  Procedures/Studies: CT ABDOMEN PELVIS WO CONTRAST  Result Date: 04/06/2021 CLINICAL DATA:  Metastatic renal cancer deliver, shortness of breath, abdominal pain, status post 1  dose of immunotherapy [place], oral chemotherapy to begin next week EXAM: CT CHEST, ABDOMEN AND PELVIS WITHOUT CONTRAST TECHNIQUE: Multidetector CT imaging of the chest, abdomen and pelvis was performed following the standard protocol without IV contrast. COMPARISON:  None. FINDINGS: CT CHEST FINDINGS Cardiovascular: Mild cardiomegaly. Small pericardial effusion. Low-density blood pool relative to myocardium, suggesting anemia. No evidence of thoracic aortic aneurysm. Atherosclerotic calcifications of the aortic root/arch. Three vessel coronary atherosclerosis. Mediastinum/Nodes: No suspicious mediastinal lymphadenopathy. 17 mm right thyroid nodule (series 2/image 3). In the setting of significant comorbidities or limited life expectancy, no follow-up recommended (ref: J Am Coll Radiol. 2015 Feb;12(2): 143-50). Lungs/Pleura: No suspicious pulmonary nodules. Mild mosaic attenuation in the lungs bilaterally. No focal consolidation. Small bilateral pleural effusions, right greater than left. No pneumothorax. Musculoskeletal: Degenerative changes of the mid/lower thoracic spine. CT ABDOMEN PELVIS FINDINGS Hepatobiliary: At least two small probable cysts in the right liver (series 2/images 49 and 53), measuring up to 12 mm. Additional vague hypodense lesions in the right hepatic dome (series 2/image 45) and lateral segment left hepatic lobe (series 2/image 58), measuring up to 2.2 cm, raising concern for possible metastases. Additional scattered hyperdense lesions in the liver (series 2/images 50, 61, and 69), including a 3.2 cm branching hyperdense lesion centrally in the right liver, possibly related to treated metastases. These lesions are all poorly evaluated on unenhanced CT. Gallbladder is unremarkable. No intrahepatic or extrahepatic ductal dilatation. Pancreas: Within normal limits. Spleen: Within normal limits. Adrenals/Urinary Tract: Adrenal glands are within normal limits. Severe bilateral renal cortical  scarring. Suspected postprocedural changes in the left lower kidney (series 2/image 70). Two right lower pole renal cysts measuring up to 3.4 cm (series 2/image 77), poorly evaluated. No hydronephrosis. Bladder is underdistended but unremarkable. Stomach/Bowel: Stomach is within normal limits. No evidence of bowel obstruction. Normal appendix (series 2/image 91). Extensive left colonic diverticulosis, without evidence of diverticulitis. Vascular/Lymphatic: No evidence of abdominal aortic aneurysm. Atherosclerotic calcifications of the abdominal aorta and branch vessels. 9 mm short axis node in the porta hepatis (series 2/image 61), within normal limits. No suspicious abdominopelvic lymphadenopathy. Reproductive: Status post hysterectomy. No adnexal masses. Other: Trace pelvic ascites. Small fat containing periumbilical hernia (series 2/image 85). Musculoskeletal: Mild degenerative changes of the lumbar spine. IMPRESSION: Limited evaluation due to lack of intravenous contrast administration. Suspected postprocedural changes involving the left lower kidney. Multiple liver lesions, including two hypodense lesions measuring up to 2.2 cm which could reflect metastatic disease. Additional vague/branching hyperdense lesions could reflect treated metastases. Correlate with prior imaging (if available). Small bilateral pleural effusions, right greater than left. Small pericardial effusion. Additional ancillary findings as above. Electronically Signed   By: Julian Hy M.D.   On: 04/06/2021 20:23   DG Chest 1 View  Result Date: 04/06/2021 CLINICAL DATA:  Nausea and vomiting. EXAM: CHEST  1 VIEW COMPARISON:  Radiograph 03/18/2021.  CT 04/03/2021 FINDINGS: Improved lung volumes from prior exam. Cardiomegaly is not significantly changed. Stable mediastinal contours. Aortic atherosclerosis. Improved vascular  congestion. Suspected small right pleural effusion. No acute airspace disease. No pneumothorax. No acute osseous  abnormalities are seen IMPRESSION: 1. Stable cardiomegaly. Improved vascular congestion from prior exam. 2. Suspected small right pleural effusion. Electronically Signed   By: Keith Rake M.D.   On: 04/06/2021 18:35   DG Chest 2 View  Result Date: 03/18/2021 CLINICAL DATA:  Missed dialysis EXAM: CHEST - 2 VIEW COMPARISON:  03/04/2021 FINDINGS: Cardiomegaly, vascular congestion. No overt edema. No confluent opacities or effusions. No acute bony abnormality. IMPRESSION: Cardiomegaly, vascular congestion. Electronically Signed   By: Rolm Baptise M.D.   On: 03/18/2021 19:15   CT Chest Wo Contrast  Result Date: 04/06/2021 CLINICAL DATA:  Metastatic renal cancer deliver, shortness of breath, abdominal pain, status post 1 dose of immunotherapy [place], oral chemotherapy to begin next week EXAM: CT CHEST, ABDOMEN AND PELVIS WITHOUT CONTRAST TECHNIQUE: Multidetector CT imaging of the chest, abdomen and pelvis was performed following the standard protocol without IV contrast. COMPARISON:  None. FINDINGS: CT CHEST FINDINGS Cardiovascular: Mild cardiomegaly. Small pericardial effusion. Low-density blood pool relative to myocardium, suggesting anemia. No evidence of thoracic aortic aneurysm. Atherosclerotic calcifications of the aortic root/arch. Three vessel coronary atherosclerosis. Mediastinum/Nodes: No suspicious mediastinal lymphadenopathy. 17 mm right thyroid nodule (series 2/image 3). In the setting of significant comorbidities or limited life expectancy, no follow-up recommended (ref: J Am Coll Radiol. 2015 Feb;12(2): 143-50). Lungs/Pleura: No suspicious pulmonary nodules. Mild mosaic attenuation in the lungs bilaterally. No focal consolidation. Small bilateral pleural effusions, right greater than left. No pneumothorax. Musculoskeletal: Degenerative changes of the mid/lower thoracic spine. CT ABDOMEN PELVIS FINDINGS Hepatobiliary: At least two small probable cysts in the right liver (series 2/images 49  and 53), measuring up to 12 mm. Additional vague hypodense lesions in the right hepatic dome (series 2/image 45) and lateral segment left hepatic lobe (series 2/image 58), measuring up to 2.2 cm, raising concern for possible metastases. Additional scattered hyperdense lesions in the liver (series 2/images 50, 61, and 69), including a 3.2 cm branching hyperdense lesion centrally in the right liver, possibly related to treated metastases. These lesions are all poorly evaluated on unenhanced CT. Gallbladder is unremarkable. No intrahepatic or extrahepatic ductal dilatation. Pancreas: Within normal limits. Spleen: Within normal limits. Adrenals/Urinary Tract: Adrenal glands are within normal limits. Severe bilateral renal cortical scarring. Suspected postprocedural changes in the left lower kidney (series 2/image 70). Two right lower pole renal cysts measuring up to 3.4 cm (series 2/image 77), poorly evaluated. No hydronephrosis. Bladder is underdistended but unremarkable. Stomach/Bowel: Stomach is within normal limits. No evidence of bowel obstruction. Normal appendix (series 2/image 91). Extensive left colonic diverticulosis, without evidence of diverticulitis. Vascular/Lymphatic: No evidence of abdominal aortic aneurysm. Atherosclerotic calcifications of the abdominal aorta and branch vessels. 9 mm short axis node in the porta hepatis (series 2/image 61), within normal limits. No suspicious abdominopelvic lymphadenopathy. Reproductive: Status post hysterectomy. No adnexal masses. Other: Trace pelvic ascites. Small fat containing periumbilical hernia (series 2/image 85). Musculoskeletal: Mild degenerative changes of the lumbar spine. IMPRESSION: Limited evaluation due to lack of intravenous contrast administration. Suspected postprocedural changes involving the left lower kidney. Multiple liver lesions, including two hypodense lesions measuring up to 2.2 cm which could reflect metastatic disease. Additional  vague/branching hyperdense lesions could reflect treated metastases. Correlate with prior imaging (if available). Small bilateral pleural effusions, right greater than left. Small pericardial effusion. Additional ancillary findings as above. Electronically Signed   By: Julian Hy M.D.   On: 04/06/2021 20:23  US Abdomen Limited  Result Date: 04/06/2021 CLINICAL DATA:  Right upper quadrant and epigastric pain. Review of radiologic records states metastatic renal cancer to liver. EXAM: ULTRASOUND ABDOMEN LIMITED RIGHT UPPER QUADRANT COMPARISON:  Noncontrast CT 04/03/2021 FINDINGS: Gallbladder: Physiologically distended. Wall thickening abutting the liver surface of 7-8 mm. No gallstones. No sonographic Murphy sign noted by sonographer. Common bile duct: Diameter: 1-2 mm, normal. Liver: Known liver lesions are not well seen on the current exam. Background normal parenchymal echogenicity. Portal vein is patent on color Doppler imaging with normal direction of blood flow towards the liver. Other: Small volume ascites in the right upper quadrant. Right pleural effusion. IMPRESSION: 1. No gallstones. Gallbladder-wall thickening of 7-8 mm, also seen on recent CT. This may be chronic, and is not favored to represent acute gallbladder inflammation. 2. No biliary dilatation. 3. Known liver lesions are not well seen on the current exam, may be technical. 4. Right pleural effusion and trace right upper quadrant ascites. Electronically Signed   By: Keith Rake M.D.   On: 04/06/2021 19:55   DG Abd Portable 1V  Result Date: 04/06/2021 CLINICAL DATA:  Nausea for 4 days, history of metastatic renal cell carcinoma EXAM: PORTABLE ABDOMEN - 1 VIEW COMPARISON:  04/03/2021 FINDINGS: Two supine frontal views of the abdomen and pelvis are obtained. The bowel gas pattern is unremarkable. Barium filled diverticular seen throughout the distal colon. No masses or abnormal calcifications. Extensive vascular calcifications are  identified. No acute or destructive bony lesions. IMPRESSION: 1. Unremarkable bowel gas pattern. Electronically Signed   By: Randa Ngo M.D.   On: 04/06/2021 22:55   ECHOCARDIOGRAM COMPLETE  Result Date: 03/19/2021    ECHOCARDIOGRAM REPORT   Patient Name:   SHERLIN SONIER Date of Exam: 03/19/2021 Medical Rec #:  [id number]        Height:       62.0 in Accession #:    [id number]       Weight:       180.0 lb Date of Birth:  29-Oct-1950        BSA:          1.828 m Patient Age:    1 years         BP:           117/67 mmHg Patient Gender: F                HR:           75 bpm. Exam Location:  Inpatient Procedure: 2D Echo, Cardiac Doppler and Color Doppler Indications:    Aortic stenosis  History:        Patient has no prior history of Echocardiogram examinations.                 Stroke, Aortic Valve Disease and severe aortic stenosis,                 Signs/Symptoms:Shortness of Breath and ESRD; Risk                 Factors:Dyslipidemia and Obesity.  Sonographer:    Dustin Flock Referring Phys: [id number] [place]  1. The aortic valve is tricuspid. There is severe calcifcation of the aortic valve. There is severe thickening of the aortic valve. Aortic valve regurgitation is mild. Severe aortic valve stenosis. Aortic valve area, by VTI measures 0.90 cm. Aortic valve mean gradient measures 50.0 mmHg. Aortic valve Vmax measures 4.61 m/s.  2. The mitral valve is  degenerative. Moderate to severe mitral valve regurgitation. No evidence of mitral stenosis. Moderate to severe mitral annular calcification.  3. Left ventricular ejection fraction, by estimation, is 55 to 60%. The left ventricle has normal function. The left ventricle has no regional wall motion abnormalities. There is moderate concentric left ventricular hypertrophy. Left ventricular diastolic function could not be evaluated.  4. Right ventricular systolic function is mildly reduced. The right ventricular size is mildly enlarged.  There is moderately elevated pulmonary artery systolic pressure. The estimated right ventricular systolic pressure is 25.0 mmHg.  5. Left atrial size was mild to moderately dilated.  6. Right atrial size was mildly dilated.  7. The inferior vena cava is dilated in size with <50% respiratory variability, suggesting right atrial pressure of 15 mmHg.  8. A small pericardial effusion is present. The pericardial effusion is circumferential. There is no evidence of cardiac tamponade. FINDINGS  Left Ventricle: Left ventricular ejection fraction, by estimation, is 55 to 60%. The left ventricle has normal function. The left ventricle has no regional wall motion abnormalities. The left ventricular internal cavity size was normal in size. There is  moderate concentric left ventricular hypertrophy. Left ventricular diastolic function could not be evaluated due to mitral annular calcification (moderate or greater). Left ventricular diastolic function could not be evaluated. Right Ventricle: The right ventricular size is mildly enlarged. No increase in right ventricular wall thickness. Right ventricular systolic function is mildly reduced. There is moderately elevated pulmonary artery systolic pressure. The tricuspid regurgitant velocity is 3.06 m/s, and with an assumed right atrial pressure of 15 mmHg, the estimated right ventricular systolic pressure is 53.9 mmHg. Left Atrium: Left atrial size was mild to moderately dilated. Right Atrium: Right atrial size was mildly dilated. Pericardium: A small pericardial effusion is present. The pericardial effusion is circumferential. There is no evidence of cardiac tamponade. Mitral Valve: The mitral valve is degenerative in appearance. Moderate to severe mitral annular calcification. Moderate to severe mitral valve regurgitation. No evidence of mitral valve stenosis. MV peak gradient, 13.1 mmHg. The mean mitral valve gradient is 5.0 mmHg. Tricuspid Valve: The tricuspid valve is grossly  normal. Tricuspid valve regurgitation is mild . No evidence of tricuspid stenosis. Aortic Valve: The aortic valve is tricuspid. There is severe calcifcation of the aortic valve. There is severe thickening of the aortic valve. Aortic valve regurgitation is mild. Aortic regurgitation PHT measures 344 msec. Severe aortic stenosis is present. Aortic valve mean gradient measures 50.0 mmHg. Aortic valve peak gradient measures 85.0 mmHg. Aortic valve area, by VTI measures 0.90 cm. Pulmonic Valve: The pulmonic valve was grossly normal. Pulmonic valve regurgitation is mild. No evidence of pulmonic stenosis. Aorta: The aortic root and ascending aorta are structurally normal, with no evidence of dilitation. Venous: The inferior vena cava is dilated in size with less than 50% respiratory variability, suggesting right atrial pressure of 15 mmHg. IAS/Shunts: The atrial septum is grossly normal.  LEFT VENTRICLE PLAX 2D LVIDd:         5.20 cm      Diastology LVIDs:         4.00 cm      LV e' medial:    4.79 cm/s LV PW:         1.60 cm      LV E/e' medial:  26.5 LV IVS:        1.70 cm      LV e' lateral:   6.42 cm/s LVOT diam:     2.20 cm  LV E/e' lateral: 19.8 LV SV:         95 LV SV Index:   52 LVOT Area:     3.80 cm  LV Volumes (MOD) LV vol d, MOD A4C: 195.0 ml LV vol s, MOD A4C: 67.2 ml LV SV MOD A4C:     195.0 ml RIGHT VENTRICLE RV Basal diam:  4.90 cm RV S prime:     8.05 cm/s TAPSE (M-mode): 1.9 cm LEFT ATRIUM             Index       RIGHT ATRIUM           Index LA diam:        4.80 cm 2.63 cm/m  RA Area:     20.50 cm LA Vol (A2C):   79.3 ml 43.38 ml/m RA Volume:   66.20 ml  36.22 ml/m LA Vol (A4C):   70.1 ml 38.35 ml/m LA Biplane Vol: 79.0 ml 43.22 ml/m  AORTIC VALVE AV Area (Vmax):    0.82 cm AV Area (Vmean):   0.80 cm AV Area (VTI):     0.90 cm AV Vmax:           461.00 cm/s AV Vmean:          331.000 cm/s AV VTI:            1.060 m AV Peak Grad:      85.0 mmHg AV Mean Grad:      50.0 mmHg LVOT Vmax:          99.20 cm/s LVOT Vmean:        70.000 cm/s LVOT VTI:          0.251 m LVOT/AV VTI ratio: 0.24 AI PHT:            344 msec  AORTA Ao Root diam: 2.90 cm MITRAL VALVE                TRICUSPID VALVE MV Area (PHT): 3.42 cm     TR Peak grad:   37.5 mmHg MV Area VTI:   2.43 cm     TR Vmax:        306.00 cm/s MV Peak grad:  13.1 mmHg MV Mean grad:  5.0 mmHg     SHUNTS MV Vmax:       1.81 m/s     Systemic VTI:  0.25 m MV Vmean:      99.0 cm/s    Systemic Diam: 2.20 cm MV Decel Time: 222 msec MV E velocity: 127.00 cm/s MV A velocity: 64.50 cm/s MV E/A ratio:  1.97 Eleonore Chiquito MD Electronically signed by Eleonore Chiquito MD Signature Date/Time: 03/19/2021/1:46:03 PM    Final      Subjective: No acute issues or events overnight   Discharge Exam: Vitals:   04/07/21 1959 04/08/21 0736  BP: (!) 91/49 100/64  Pulse: 73 72  Resp: 20 14  Temp: 98.6 F (37 C) (!) 97.5 F (36.4 C)  SpO2:  93%   Vitals:   04/07/21 1435 04/07/21 1550 04/07/21 1959 04/08/21 0736  BP: 119/61 112/83 (!) 91/49 100/64  Pulse:  73 73 72  Resp: 16 17 20 14   Temp: 98.5 F (36.9 C) 98.3 F (36.8 C) 98.6 F (37 C) (!) 97.5 F (36.4 C)  TempSrc: Oral Oral Oral Oral  SpO2: 95% 94%  93%  Weight: 84 kg     Height:        General: Pt is alert,  awake, not in acute distress Cardiovascular: RRR, S1/S2 +, no rubs, no gallops Respiratory: CTA bilaterally, no wheezing, no rhonchi Abdominal: Soft, NT, ND, bowel sounds + Extremities: no edema, no cyanosis    The results of significant diagnostics from this hospitalization (including imaging, microbiology, ancillary and laboratory) are listed below for reference.     Microbiology: Recent Results (from the past 240 hour(s))  SARS CORONAVIRUS 2 (TAT 6-24 HRS) Nasopharyngeal Nasopharyngeal Swab     Status: None   Collection Time: 04/07/21  8:57 AM   Specimen: Nasopharyngeal Swab  Result Value Ref Range Status   SARS Coronavirus 2 NEGATIVE NEGATIVE Final    Comment:  (NOTE) SARS-CoV-2 target nucleic acids are NOT DETECTED.  The SARS-CoV-2 RNA is generally detectable in upper and lower respiratory specimens during the acute phase of infection. Negative results do not preclude SARS-CoV-2 infection, do not rule out co-infections with other pathogens, and should not be used as the sole basis for treatment or other patient management decisions. Negative results must be combined with clinical observations, patient history, and epidemiological information. The expected result is Negative.  Fact Sheet for Patients: SugarRoll.be  Fact Sheet for Healthcare Providers: https://www.woods-mathews.com/  This test is not yet approved or cleared by the [place] FDA and  has been authorized for detection and/or diagnosis of SARS-CoV-2 by FDA under an Emergency Use Authorization (EUA). This EUA will remain  in effect (meaning this test can be used) for the duration of the COVID-19 declaration under Se ction 564(b)(1) of the Act, 21 U.S.C. section 360bbb-3(b)(1), unless the authorization is terminated or revoked sooner.  Performed at [facility], [place] [address], [place], [place] [address]      Labs: BNP (last 3 results) Recent Labs    03/04/21 1553  BNP 3,664.4*   Basic Metabolic Panel: Recent Labs  Lab 04/06/21 1101 04/07/21 0354 04/08/21 0204  NA 136 136 134*  K 4.3 4.3 3.4*  CL 93* 95* 94*  CO2 24 24 26   GLUCOSE 113* 97 85  BUN 38* 46* 26*  CREATININE 5.64* 6.15* 4.16*  CALCIUM 9.7 9.9 8.9   Liver Function Tests: Recent Labs  Lab 04/06/21 1101 04/07/21 0354  AST 77* 60*  ALT 55* 52*  ALKPHOS 66 76  BILITOT 1.6* 1.4*  PROT 7.1 6.6  ALBUMIN 3.4* 3.3*   Recent Labs  Lab 04/06/21 1101  LIPASE 36   No results for input(s): AMMONIA in the last 168 hours. CBC: Recent Labs  Lab 04/06/21 1101 04/07/21 0354 04/08/21 0204  WBC 7.8 8.1 6.1  HGB 9.4* 9.0* 8.4*  HCT 30.5* 29.1*  27.3*  MCV 99.0 100.7* 98.6  PLT 236 208 178   Cardiac Enzymes: No results for input(s): CKTOTAL, CKMB, CKMBINDEX, TROPONINI in the last 168 hours. BNP: Invalid input(s): POCBNP CBG: No results for input(s): GLUCAP in the last 168 hours. D-Dimer No results for input(s): DDIMER in the last 72 hours. Hgb A1c No results for input(s): HGBA1C in the last 72 hours. Lipid Profile No results for input(s): CHOL, HDL, LDLCALC, TRIG, CHOLHDL, LDLDIRECT in the last 72 hours. Thyroid function studies No results for input(s): TSH, T4TOTAL, T3FREE, THYROIDAB in the last 72 hours.  Invalid input(s): FREET3 Anemia work up No results for input(s): VITAMINB12, FOLATE, FERRITIN, TIBC, IRON, RETICCTPCT in the last 72 hours. Urinalysis    Component Value Date/Time   COLORURINE AMBER (A) 04/06/2021 1101   APPEARANCEUR HAZY (A) 04/06/2021 1101   LABSPEC 1.016 04/06/2021 1101  PHURINE 6.0 04/06/2021 1101   GLUCOSEU NEGATIVE 04/06/2021 1101   HGBUR SMALL (A) 04/06/2021 1101   BILIRUBINUR NEGATIVE 04/06/2021 1101   KETONESUR NEGATIVE 04/06/2021 1101   PROTEINUR 100 (A) 04/06/2021 1101   NITRITE NEGATIVE 04/06/2021 1101   LEUKOCYTESUR MODERATE (A) 04/06/2021 1101   Sepsis Labs Invalid input(s): PROCALCITONIN,  WBC,  LACTICIDVEN Microbiology Recent Results (from the past 240 hour(s))  SARS CORONAVIRUS 2 (TAT 6-24 HRS) Nasopharyngeal Nasopharyngeal Swab     Status: None   Collection Time: 04/07/21  8:57 AM   Specimen: Nasopharyngeal Swab  Result Value Ref Range Status   SARS Coronavirus 2 NEGATIVE NEGATIVE Final    Comment: (NOTE) SARS-CoV-2 target nucleic acids are NOT DETECTED.  The SARS-CoV-2 RNA is generally detectable in upper and lower respiratory specimens during the acute phase of infection. Negative results do not preclude SARS-CoV-2 infection, do not rule out co-infections with other pathogens, and should not be used as the sole basis for treatment or other patient management  decisions. Negative results must be combined with clinical observations, patient history, and epidemiological information. The expected result is Negative.  Fact Sheet for Patients: SugarRoll.be  Fact Sheet for Healthcare Providers: https://www.woods-mathews.com/  This test is not yet approved or cleared by the [place] FDA and  has been authorized for detection and/or diagnosis of SARS-CoV-2 by FDA under an Emergency Use Authorization (EUA). This EUA will remain  in effect (meaning this test can be used) for the duration of the COVID-19 declaration under Se ction 564(b)(1) of the Act, 21 U.S.C. section 360bbb-3(b)(1), unless the authorization is terminated or revoked sooner.  Performed at [facility], [place] [address] [place], [place] [address]      Time coordinating discharge: Over 30 minutes  SIGNED:   Little Ishikawa, DO [facility] 04/08/2021, 5:00 PM Pager   If 7PM-7AM, please contact night-coverage [email or web address]

## 2021-04-08 NOTE — Progress Notes (Signed)
Discharge summary packet provided to pt with instructions. Pt verbalized understanding of instructions.  Pt D/C to home as ordered. Pt remains alert/oriented in no apparent distress. No complaints.

## 2021-04-09 ENCOUNTER — Ambulatory Visit: Payer: Medicare Other | Admitting: Nurse Practitioner

## 2021-04-13 ENCOUNTER — Other Ambulatory Visit: Payer: Self-pay | Admitting: Oncology

## 2021-04-13 ENCOUNTER — Other Ambulatory Visit: Payer: Self-pay | Admitting: Pharmacist

## 2021-04-13 ENCOUNTER — Telehealth: Payer: Self-pay | Admitting: *Deleted

## 2021-04-13 DIAGNOSIS — C787 Secondary malignant neoplasm of liver and intrahepatic bile duct: Secondary | ICD-10-CM

## 2021-04-13 DIAGNOSIS — C649 Malignant neoplasm of unspecified kidney, except renal pelvis: Secondary | ICD-10-CM

## 2021-04-13 MED ORDER — CABOMETYX 40 MG PO TABS: 40.0000 mg | ORAL_TABLET | Freq: Every day | ORAL | 1 refills | Status: DC

## 2021-04-13 NOTE — Progress Notes (Signed)
The results of the CT scan were personally reviewed and discussed with the patient via phone today.  Her disease is regressing based on the immunotherapy and I recommended proceeding with Cabometyx at this time.  I reiterated the side effects associated with this treatment including GI toxicity and diarrhea.  She has follow-up on August 15 after she has been on it for a few weeks to assess for any toxicity or complications.  All her questions were answered today.

## 2021-04-13 NOTE — Telephone Encounter (Signed)
Patients son called and wanted to know if they could get the CT results.  Has appointment scheduled for August 15th but doesn't want to wait that long.  Routed to Dr Alen Blew to ask if patient's appointment can be moved up.  Son also states that she should be getting her medication (Cabometyx) in a few days.

## 2021-04-13 NOTE — Progress Notes (Addendum)
Oral Chemotherapy Pharmacist Encounter   Spoke with patient's son, Loran Auguste, today to follow up regarding patient's oral chemotherapy medication: Cabometyx (cabozantinib)  Mr. Gomes is setting up shipment of Cabometyx from [facility] to be delivered to patient's home. Cabometyx has been added back to patient's medication list as this was inadvertently discontinued from patient's medication list at discharge from recent hospitalization. Confirmed with Dr. Alen Blew patient is to be on Cabometyx.   Patient's family knows to call the office with questions or concerns.  Leron Croak, PharmD, BCPS Hematology/Oncology Clinical Pharmacist [facility] [phone] 04/13/2021 10:50 AM

## 2021-04-27 ENCOUNTER — Inpatient Hospital Stay: Payer: Medicare Other | Attending: Oncology

## 2021-04-27 ENCOUNTER — Other Ambulatory Visit: Payer: Self-pay | Admitting: *Deleted

## 2021-04-27 ENCOUNTER — Other Ambulatory Visit: Payer: Self-pay

## 2021-04-27 VITALS — BP 126/58 | HR 79 | Temp 98.0°F | Resp 18

## 2021-04-27 DIAGNOSIS — C649 Malignant neoplasm of unspecified kidney, except renal pelvis: Secondary | ICD-10-CM

## 2021-04-27 DIAGNOSIS — R627 Adult failure to thrive: Secondary | ICD-10-CM | POA: Insufficient documentation

## 2021-04-27 DIAGNOSIS — Z992 Dependence on renal dialysis: Secondary | ICD-10-CM | POA: Diagnosis not present

## 2021-04-27 DIAGNOSIS — Z7901 Long term (current) use of anticoagulants: Secondary | ICD-10-CM | POA: Insufficient documentation

## 2021-04-27 DIAGNOSIS — J90 Pleural effusion, not elsewhere classified: Secondary | ICD-10-CM | POA: Diagnosis not present

## 2021-04-27 DIAGNOSIS — Z79899 Other long term (current) drug therapy: Secondary | ICD-10-CM | POA: Insufficient documentation

## 2021-04-27 DIAGNOSIS — C787 Secondary malignant neoplasm of liver and intrahepatic bile duct: Secondary | ICD-10-CM | POA: Diagnosis present

## 2021-04-27 DIAGNOSIS — I350 Nonrheumatic aortic (valve) stenosis: Secondary | ICD-10-CM | POA: Insufficient documentation

## 2021-04-27 DIAGNOSIS — C642 Malignant neoplasm of left kidney, except renal pelvis: Secondary | ICD-10-CM | POA: Insufficient documentation

## 2021-04-27 DIAGNOSIS — N19 Unspecified kidney failure: Secondary | ICD-10-CM | POA: Insufficient documentation

## 2021-04-27 MED ORDER — DIPHENHYDRAMINE HCL 25 MG PO CAPS
ORAL_CAPSULE | ORAL | Status: AC
  Filled 2021-04-27: qty 1

## 2021-04-27 MED ORDER — DIPHENHYDRAMINE HCL 25 MG PO CAPS
25.0000 mg | ORAL_CAPSULE | Freq: Once | ORAL | Status: AC
  Administered 2021-04-27: 25 mg via ORAL

## 2021-04-27 MED ORDER — SODIUM CHLORIDE 0.9 % IV SOLN: 8.0000 mg | Freq: Once | INTRAVENOUS | Status: DC

## 2021-04-27 MED ORDER — ONDANSETRON HCL 4 MG/2ML IJ SOLN
INTRAMUSCULAR | Status: AC
  Filled 2021-04-27: qty 4

## 2021-04-27 MED ORDER — ONDANSETRON HCL 8 MG PO TABS: 8.0000 mg | ORAL_TABLET | Freq: Once | ORAL | Status: DC

## 2021-04-27 MED ORDER — SODIUM CHLORIDE 0.9 % IV SOLN
Freq: Once | INTRAVENOUS | Status: DC
  Filled 2021-04-27: qty 250

## 2021-04-27 MED ORDER — SODIUM CHLORIDE 0.9 % IV SOLN
Freq: Once | INTRAVENOUS | Status: AC
  Filled 2021-04-27: qty 250

## 2021-04-27 MED ORDER — ONDANSETRON HCL 4 MG/2ML IJ SOLN
8.0000 mg | Freq: Once | INTRAMUSCULAR | Status: AC
  Administered 2021-04-27: 8 mg via INTRAVENOUS

## 2021-04-27 NOTE — Patient Instructions (Signed)

## 2021-04-27 NOTE — Progress Notes (Signed)
Secure Chat message received from [facility].  I had a phone call from patient's son just now, and it sounds like Sonya Beck is having a really rough time with nausea ever since starting the Cabometyx. He said they are out of antiemetics at home, and the son stated she has gotten to the point she is dry heaving.Marland KitchenMarland KitchenShe definitely needs some type of antiemetic, or combo of both ondansetron and prochlorperazine, but I'm not sure if Dr. Alen Blew would typically want her to come in as well for fluids? (I know he's on PAL, sonot sure what Dr. Julien Nordmann may prefer)   Son is going to bring patient in for fluids and zofran.  Per Dr Julien Nordmann ok for IVF and Zofran 8 mg.  Since Renal patient only going to give total of 500 ml over 2 hours.

## 2021-04-27 NOTE — Progress Notes (Signed)
Patient reports feeling a little better after fluids, zofran 8 mg IV, and oral benadryl 25 mg.  She was nauseated and dry heaving before and her skin is very dry and itchy.  Advised her to use a good lotion on her skin as well that is not scented. Her son was present and also said she has been acting confused the past couple of days.  This was not normal behavior for her.  Sent a message to Dr. Alen Blew and his desk nurse to follow up with patient tomorrow. Gardiner Rhyme, RN

## 2021-04-28 ENCOUNTER — Telehealth: Payer: Self-pay | Admitting: *Deleted

## 2021-04-28 NOTE — Telephone Encounter (Signed)
PC to patient's son [name].  Dr. Alen Blew advises that the patient stop taking the cabometyx until her next appointment with him on 05/04/21. Also instructed [name] to call before the 15th if he has any further questions/concerns about his mother.  He verbalizes understanding.

## 2021-04-28 NOTE — Telephone Encounter (Signed)
-----   Message from Wyatt Portela, MD sent at 04/28/2021  2:34 PM EDT ----- Hold the medication till next MD visit. Thanks ----- Message ----- From: Rolene Course, RN Sent: 04/28/2021   2:29 PM EDT To: Wyatt Portela, MD  This patient started her cabometyx on 04/20/21 according to her son.  She has been having vomiting, dry heaving, insomnia, severe exhaustion & some confusion since beginning the medication.  She came in yesterday for fluids & zofran.  While here, she had a lot of itching so she was also given benadryl.  I called to check on her today & her son states she continues to have the side effects.  He is asking if she needs to stop the cabometyx.  He says she is really having a tough time.  She has an appointment with you on 8/15.  What is your advice?  Thanks, Bethena Roys

## 2021-05-04 ENCOUNTER — Inpatient Hospital Stay: Payer: Medicare Other

## 2021-05-04 ENCOUNTER — Inpatient Hospital Stay (HOSPITAL_BASED_OUTPATIENT_CLINIC_OR_DEPARTMENT_OTHER): Payer: Medicare Other | Admitting: Oncology

## 2021-05-04 ENCOUNTER — Other Ambulatory Visit: Payer: Self-pay

## 2021-05-04 ENCOUNTER — Telehealth: Payer: Self-pay

## 2021-05-04 VITALS — BP 99/86 | HR 81 | Temp 98.3°F | Resp 18 | Ht 62.0 in

## 2021-05-04 DIAGNOSIS — C787 Secondary malignant neoplasm of liver and intrahepatic bile duct: Secondary | ICD-10-CM

## 2021-05-04 DIAGNOSIS — C649 Malignant neoplasm of unspecified kidney, except renal pelvis: Secondary | ICD-10-CM

## 2021-05-04 DIAGNOSIS — C642 Malignant neoplasm of left kidney, except renal pelvis: Secondary | ICD-10-CM | POA: Diagnosis not present

## 2021-05-04 LAB — CMP (CANCER CENTER ONLY)
ALT: 33 U/L (ref 0–44)
AST: 26 U/L (ref 15–41)
Albumin: 3.1 g/dL — ABNORMAL LOW (ref 3.5–5.0)
Alkaline Phosphatase: 103 U/L (ref 38–126)
Anion gap: 19 — ABNORMAL HIGH (ref 5–15)
BUN: 81 mg/dL — ABNORMAL HIGH (ref 8–23)
CO2: 23 mmol/L (ref 22–32)
Calcium: 9.5 mg/dL (ref 8.9–10.3)
Chloride: 96 mmol/L — ABNORMAL LOW (ref 98–111)
Creatinine: 6.06 mg/dL (ref 0.44–1.00)
GFR, Estimated: 7 mL/min — ABNORMAL LOW (ref >60)
Glucose, Bld: 145 mg/dL — ABNORMAL HIGH (ref 70–99)
Potassium: 4.6 mmol/L (ref 3.5–5.1)
Sodium: 138 mmol/L (ref 135–145)
Total Bilirubin: 1.2 mg/dL (ref 0.3–1.2)
Total Protein: 6.8 g/dL (ref 6.5–8.1)

## 2021-05-04 LAB — CBC WITH DIFFERENTIAL (CANCER CENTER ONLY)
Abs Immature Granulocytes: 0.02 10*3/uL (ref 0.00–0.07)
Basophils Absolute: 0 10*3/uL (ref 0.0–0.1)
Basophils Relative: 0 %
Eosinophils Absolute: 0 10*3/uL (ref 0.0–0.5)
Eosinophils Relative: 1 %
HCT: 35.6 % — ABNORMAL LOW (ref 36.0–46.0)
Hemoglobin: 11.6 g/dL — ABNORMAL LOW (ref 12.0–15.0)
Immature Granulocytes: 0 %
Lymphocytes Relative: 16 %
Lymphs Abs: 1 10*3/uL (ref 0.7–4.0)
MCH: 31.9 pg (ref 26.0–34.0)
MCHC: 32.6 g/dL (ref 30.0–36.0)
MCV: 97.8 fL (ref 80.0–100.0)
Monocytes Absolute: 0.3 10*3/uL (ref 0.1–1.0)
Monocytes Relative: 5 %
Neutro Abs: 4.6 10*3/uL (ref 1.7–7.7)
Neutrophils Relative %: 78 %
Platelet Count: 89 10*3/uL — ABNORMAL LOW (ref 150–400)
RBC: 3.64 MIL/uL — ABNORMAL LOW (ref 3.87–5.11)
RDW: 20.5 % — ABNORMAL HIGH (ref 11.5–15.5)
WBC Count: 5.9 10*3/uL (ref 4.0–10.5)
nRBC: 0 % (ref 0.0–0.2)

## 2021-05-04 NOTE — Telephone Encounter (Signed)
CRITICAL VALUE STICKER  CRITICAL VALUE: Creatinine = 6.06  RECEIVER (on-site recipient of call): Yetta Glassman, [place] NOTIFIED: 05/04/21 at 10:23a  MESSENGER (representative from lab): Ulice Dash  MD NOTIFIED: Dr. Alen Blew  TIME OF NOTIFICATION: 05/04/21 at 10:33a  RESPONSE: Notification given to Bethena Roys, RN for follow-up with provider and pt.

## 2021-05-04 NOTE — Progress Notes (Signed)
Hematology and Oncology Follow Up Visit  Sonya Beck [id number] 03-19-1951 70 y.o. 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   Prior Therapy:  Ipilimumab and nivolumab 1 cycle of therapy given in June 2022 while she was living in [place].  Therapy discontinued due to poor tolerance.  Current therapy:   Cabometyx 40 mg daily started in July 2022.  Therapy currently on hold.  Interim History: [name] returns today for a follow-up visit.  Since the last visit, she continues to do poorly and has overall poor health.  She has tolerated Cabometyx rather poorly with significant GI toxicity including nausea and vomiting.  Cabometyx was withheld for the last 4 days with improvement in her symptoms.  She still has a poor p.o. intake and overall significant failure to thrive and decline in her performance status.  He denies any shortness of breath or difficulty breathing at rest but does have significant shortness of breath on exertion.     Medications: I have reviewed the patient's current medications.  Current Outpatient Medications  Medication Sig Dispense Refill   apixaban (ELIQUIS) 5 MG TABS tablet Take 1 tablet (5 mg total) by mouth 2 (two) times daily. 60 tablet 3   cabozantinib (CABOMETYX) 40 MG tablet Take 1 tablet (40 mg total) by mouth daily. Take on an empty stomach, 1 hour before or 2 hours after meals. 30 tablet 1   cabozantinib (CABOMETYX) 40 MG tablet Take by mouth.     iron sucrose in sodium chloride 0.9 % 100 mL Iron Sucrose (Venofer)     ondansetron (ZOFRAN) 4 MG tablet Take 1 tablet (4 mg total) by mouth every 6 (six) hours as needed for nausea. 20 tablet 0   ondansetron (ZOFRAN) 4 MG tablet Take by mouth.     pantoprazole (PROTONIX) 40 MG tablet Take 1 tablet (40 mg total) by mouth daily. 30 tablet 3    pantoprazole (PROTONIX) 40 MG tablet Take by mouth.     promethazine (PHENERGAN) 12.5 MG suppository Place 1 suppository (12.5 mg total) rectally every 6 (six) hours as needed for refractory nausea / vomiting. 12 each 0   promethazine (PHENERGAN) 25 MG tablet Take 25 mg by mouth 2 (two) times daily as needed.     No current facility-administered medications for this visit.     Allergies:  Allergies  Allergen Reactions   Heparin Other (See Comments)    HITT   Penicillins Swelling    Swelling in mouth      Physical Exam: Blood pressure 99/86, pulse 81, temperature 98.3 F (36.8 C), temperature source Oral, resp. rate 18, height 5\' 2"  (1.575 m), SpO2 99 %. ECOG: 2   General appearance: Comfortable appearing without any discomfort Head: Normocephalic without any trauma Oropharynx: Mucous membranes are moist and pink without any thrush or ulcers. Eyes: Pupils are equal and round reactive to light. Lymph nodes: No cervical, supraclavicular, inguinal or axillary lymphadenopathy.   Heart:regular rate and rhythm.  S1 and S2.  Systolic ejection murmur noted. Lung: Clear without any rhonchi or wheezes.  No dullness to percussion. Abdomin: Soft, nontender, nondistended with good bowel sounds.  No hepatosplenomegaly. Musculoskeletal: No joint deformity or effusion.  Full range of motion noted. Neurological: No deficits noted on motor, sensory and deep tendon reflex exam. Skin: No petechial rash or dryness.  Appeared moist.    Lab  Results: Lab Results  Component Value Date   WBC 5.9 05/04/2021   HGB 11.6 (L) 05/04/2021   HCT 35.6 (L) 05/04/2021   MCV 97.8 05/04/2021   PLT 89 (L) 05/04/2021     Chemistry      Component Value Date/Time   NA 138 05/04/2021 0944   K 4.6 05/04/2021 0944   CL 96 (L) 05/04/2021 0944   CO2 23 05/04/2021 0944   BUN 81 (H) 05/04/2021 0944   CREATININE 6.06 (HH) 05/04/2021 0944      Component Value Date/Time   CALCIUM 9.5 05/04/2021 0944   ALKPHOS  103 05/04/2021 0944   AST 26 05/04/2021 0944   ALT 33 05/04/2021 0944   BILITOT 1.2 05/04/2021 0944      IMPRESSION: Limited evaluation due to lack of intravenous contrast administration.   Suspected postprocedural changes involving the left lower kidney.   Multiple liver lesions, including two hypodense lesions measuring up to 2.2 cm which could reflect metastatic disease. Additional vague/branching hyperdense lesions could reflect treated metastases. Correlate with prior imaging (if available).   Small bilateral pleural effusions, right greater than left. Small pericardial effusion.   Additional ancillary findings as above.     Impression and Plan:  70 year old woman with:  1.  Stage IV clear-cell renal cell carcinoma diagnosed in May 2022 with hepatic involvement.   Her disease status was updated at this time and Treatment choices were reviewed.  CT scan obtained on April 03, 2021 was discussed and showed a positive response to therapy after 1 cycle of immunotherapy at this time.  Treatment options moving forward were discussed which including continued Cabometyx, versus switching to immunotherapy versus continued observation.  Given her overall poor health and quite significant debilitation I recommended withholding treatment at this time.  She tolerated immunotherapy very poorly and also did not tolerate Cabometyx.  Given her low-volume disease at this time and other comorbidities, I recommended a short period of observation and to reinstitute therapy once her performance status is improved slightly.  Resume Cabometyx at a lower dose versus switching to a different agent could also be considered.     2.  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  3.  Renal failure: Her BUN slightly higher related to poor p.o. intake which I encouraged to increase.  She is currently dialysis dependent.  4.  Prognosis and goals of care:  Therapy remains palliative although she is still desiring aggressive measures which is reasonable.   5.  Aortic stenosis: She has follow-up with cardiology regarding this issue.   6.  Follow-up: In 4 weeks for repeat follow-up.   30  minutes were spent on this encounter.  The time was dedicated to reviewing laboratory data, disease status update and outlining future plan of care.            Zola Button, MD 8/15/202210:42 AM

## 2021-05-04 NOTE — Progress Notes (Signed)
CRITICAL VALUE STICKER  CRITICAL VALUE: creatnine 6.06  RECEIVER (on-site recipient of call): Ansyi  DATE & TIME NOTIFIED: 05/04/21 @ 1035  MESSENGER (representative from lab):  MD NOTIFIED: Dr. Alen Blew  TIME OF NOTIFICATION: 6751  RESPONSE:  MD aware

## 2021-05-04 NOTE — Progress Notes (Signed)
Called the patient and left a message on her voicemail.  I reviewed today's office note with plans to follow-up in 4 weeks.  I think it makes most sense for Sonya Beck to defer care to oncology and see her as needed for her aortic valve disease pending her clinical course in the future.

## 2021-05-07 ENCOUNTER — Other Ambulatory Visit: Payer: Self-pay

## 2021-05-07 ENCOUNTER — Emergency Department (HOSPITAL_COMMUNITY): Payer: Medicare Other

## 2021-05-07 ENCOUNTER — Encounter (HOSPITAL_COMMUNITY): Payer: Self-pay | Admitting: Radiology

## 2021-05-07 ENCOUNTER — Emergency Department (HOSPITAL_COMMUNITY)
Admission: EM | Admit: 2021-05-07 | Discharge: 2021-05-07 | Disposition: A | Discharge status: Home / Self Care | Payer: Medicare Other | Attending: Emergency Medicine | Admitting: Emergency Medicine

## 2021-05-07 DIAGNOSIS — Z7901 Long term (current) use of anticoagulants: Secondary | ICD-10-CM | POA: Insufficient documentation

## 2021-05-07 DIAGNOSIS — Z86718 Personal history of other venous thrombosis and embolism: Secondary | ICD-10-CM | POA: Insufficient documentation

## 2021-05-07 DIAGNOSIS — R0789 Other chest pain: Secondary | ICD-10-CM | POA: Insufficient documentation

## 2021-05-07 DIAGNOSIS — Z85528 Personal history of other malignant neoplasm of kidney: Secondary | ICD-10-CM | POA: Insufficient documentation

## 2021-05-07 DIAGNOSIS — N186 End stage renal disease: Secondary | ICD-10-CM | POA: Insufficient documentation

## 2021-05-07 DIAGNOSIS — Z992 Dependence on renal dialysis: Secondary | ICD-10-CM | POA: Insufficient documentation

## 2021-05-07 DIAGNOSIS — I120 Hypertensive chronic kidney disease with stage 5 chronic kidney disease or end stage renal disease: Secondary | ICD-10-CM | POA: Diagnosis not present

## 2021-05-07 DIAGNOSIS — W19XXXA Unspecified fall, initial encounter: Secondary | ICD-10-CM | POA: Insufficient documentation

## 2021-05-07 DIAGNOSIS — Z87891 Personal history of nicotine dependence: Secondary | ICD-10-CM | POA: Insufficient documentation

## 2021-05-07 DIAGNOSIS — Z8542 Personal history of malignant neoplasm of other parts of uterus: Secondary | ICD-10-CM | POA: Diagnosis not present

## 2021-05-07 DIAGNOSIS — N898 Other specified noninflammatory disorders of vagina: Secondary | ICD-10-CM | POA: Insufficient documentation

## 2021-05-07 DIAGNOSIS — C642 Malignant neoplasm of left kidney, except renal pelvis: Secondary | ICD-10-CM | POA: Diagnosis not present

## 2021-05-07 DIAGNOSIS — M542 Cervicalgia: Secondary | ICD-10-CM | POA: Diagnosis not present

## 2021-05-07 DIAGNOSIS — D631 Anemia in chronic kidney disease: Secondary | ICD-10-CM | POA: Diagnosis not present

## 2021-05-07 DIAGNOSIS — M25552 Pain in left hip: Secondary | ICD-10-CM | POA: Insufficient documentation

## 2021-05-07 DIAGNOSIS — R42 Dizziness and giddiness: Secondary | ICD-10-CM | POA: Insufficient documentation

## 2021-05-07 LAB — I-STAT CHEM 8, ED
BUN: 42 mg/dL — ABNORMAL HIGH (ref 8–23)
Calcium, Ion: 0.94 mmol/L — ABNORMAL LOW (ref 1.15–1.40)
Chloride: 99 mmol/L (ref 98–111)
Creatinine, Ser: 3.5 mg/dL — ABNORMAL HIGH (ref 0.44–1.00)
Glucose, Bld: 101 mg/dL — ABNORMAL HIGH (ref 70–99)
HCT: 37 % (ref 36.0–46.0)
Hemoglobin: 12.6 g/dL (ref 12.0–15.0)
Potassium: 3.6 mmol/L (ref 3.5–5.1)
Sodium: 138 mmol/L (ref 135–145)
TCO2: 28 mmol/L (ref 22–32)

## 2021-05-07 LAB — CBC WITH DIFFERENTIAL/PLATELET
Abs Immature Granulocytes: 0.01 10*3/uL (ref 0.00–0.07)
Basophils Absolute: 0 10*3/uL (ref 0.0–0.1)
Basophils Relative: 1 %
Eosinophils Absolute: 0 10*3/uL (ref 0.0–0.5)
Eosinophils Relative: 0 %
HCT: 37.3 % (ref 36.0–46.0)
Hemoglobin: 11.6 g/dL — ABNORMAL LOW (ref 12.0–15.0)
Immature Granulocytes: 0 %
Lymphocytes Relative: 20 %
Lymphs Abs: 1 10*3/uL (ref 0.7–4.0)
MCH: 31.2 pg (ref 26.0–34.0)
MCHC: 31.1 g/dL (ref 30.0–36.0)
MCV: 100.3 fL — ABNORMAL HIGH (ref 80.0–100.0)
Monocytes Absolute: 0.2 10*3/uL (ref 0.1–1.0)
Monocytes Relative: 4 %
Neutro Abs: 3.9 10*3/uL (ref 1.7–7.7)
Neutrophils Relative %: 75 %
Platelets: 101 10*3/uL — ABNORMAL LOW (ref 150–400)
RBC: 3.72 MIL/uL — ABNORMAL LOW (ref 3.87–5.11)
RDW: 19.9 % — ABNORMAL HIGH (ref 11.5–15.5)
WBC: 5.1 10*3/uL (ref 4.0–10.5)
nRBC: 0 % (ref 0.0–0.2)

## 2021-05-07 LAB — COMPREHENSIVE METABOLIC PANEL
ALT: 26 U/L (ref 0–44)
AST: 31 U/L (ref 15–41)
Albumin: 3.1 g/dL — ABNORMAL LOW (ref 3.5–5.0)
Alkaline Phosphatase: 79 U/L (ref 38–126)
Anion gap: 14 (ref 5–15)
BUN: 42 mg/dL — ABNORMAL HIGH (ref 8–23)
CO2: 27 mmol/L (ref 22–32)
Calcium: 8.5 mg/dL — ABNORMAL LOW (ref 8.9–10.3)
Chloride: 98 mmol/L (ref 98–111)
Creatinine, Ser: 3.45 mg/dL — ABNORMAL HIGH (ref 0.44–1.00)
GFR, Estimated: 14 mL/min — ABNORMAL LOW (ref >60)
Glucose, Bld: 103 mg/dL — ABNORMAL HIGH (ref 70–99)
Potassium: 3.7 mmol/L (ref 3.5–5.1)
Sodium: 139 mmol/L (ref 135–145)
Total Bilirubin: 1.1 mg/dL (ref 0.3–1.2)
Total Protein: 6.8 g/dL (ref 6.5–8.1)

## 2021-05-07 LAB — PROTIME-INR
INR: 1.1 (ref 0.8–1.2)
Prothrombin Time: 14.1 seconds (ref 11.4–15.2)

## 2021-05-07 MED ORDER — FENTANYL CITRATE PF 50 MCG/ML IJ SOSY
50.0000 ug | PREFILLED_SYRINGE | Freq: Once | INTRAMUSCULAR | Status: AC
  Administered 2021-05-07: 50 ug via INTRAVENOUS
  Filled 2021-05-07: qty 1

## 2021-05-07 NOTE — ED Provider Notes (Signed)
[facility] EMERGENCY DEPARTMENT Provider Note   CSN: [id number] Arrival date & time: 05/07/21  1656   History Chief Complaint  Patient presents with   Lytle Michaels   Sonya Beck is a 70 y.o. female.    Fall Pertinent negatives include no chest pain, no abdominal pain and no shortness of breath.  70 year old female with a past medical history of known endometrial adenocarcinoma with known metastases actively undergoing treatments, previous DVT on anticoagulation, ESRD on HD presenting to the emergency department following a fall.  Patient and EMS agree that the patient had completed a full session of dialysis today.  When she left the facility ambulating on her own, she became lightheaded and fell.  Patient reports that she fell onto her left side.  She states that she fell onto the left side of her hip and her chest, she denies hitting her head or passing out.  She is reporting some left hip pain, left chest wall pain, and left sided neck pain.  This pain is sharp, constant, aching, nonradiating.  No therapies tried prior to arrival.  Past Medical History:  Diagnosis Date   Cancer [facility])    Endometrial adenocarcinoma ([place]) 04/17/2018   Fibromyalgia 03/18/2021   Gastroesophageal reflux disease without esophagitis 03/18/2021   History of blood transfusion    History of DVT of lower extremity 03/18/2021   History of stroke 03/18/2021   Metastatic renal cell carcinoma to liver ([place]) 03/18/2021   Severe aortic stenosis 03/18/2021   Stroke [facility])     Patient Active Problem List   Diagnosis Date Noted   Intractable vomiting with nausea, unspecified vomiting type 04/07/2021   Intractable nausea and vomiting 04/06/2021   Epigastric pain    Encounter to establish care 03/18/2021   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     Family History  Problem Relation Age of Onset   Heart disease Neg Hx     Social History   Tobacco Use   Smoking status: Former    Types: Cigarettes   Smokeless tobacco: Never  Vaping Use   Vaping Use: Never used  Substance Use Topics   Alcohol use: Not Currently   Drug use: Never    Home Medications Prior to Admission medications   Medication Sig Start Date End Date Taking? Authorizing Provider  apixaban (ELIQUIS) 5 MG TABS tablet Take 1 tablet (5 mg total) by mouth 2 (two) times daily. 03/18/21   Ronnell Freshwater, NP  cabozantinib (CABOMETYX) 40 MG tablet Take 1 tablet (40 mg total) by mouth daily. Take on an empty stomach, 1 hour before or 2 hours after meals. 04/13/21   Wyatt Portela, MD  cabozantinib (CABOMETYX) 40 MG tablet Take by mouth. 04/21/21   [provider]  iron sucrose in sodium chloride 0.9 % 100 mL Iron Sucrose (Venofer) 04/16/21 04/08/22  [provider]  ondansetron (ZOFRAN) 4 MG tablet Take  1 tablet (4 mg total) by mouth every 6 (six) hours as needed for nausea. 04/08/21   Little Ishikawa, MD  ondansetron (ZOFRAN) 4 MG tablet Take by mouth. 04/21/21   [provider]  pantoprazole (PROTONIX) 40 MG tablet Take 1 tablet (40 mg total) by mouth daily. 03/18/21   Ronnell Freshwater, NP  pantoprazole (PROTONIX) 40 MG tablet Take by mouth. 04/21/21   [provider]  promethazine (PHENERGAN) 12.5 MG suppository Place 1 suppository (12.5 mg total) rectally every 6 (six) hours as needed for refractory nausea / vomiting. 04/08/21    Little Ishikawa, MD  promethazine (PHENERGAN) 25 MG tablet Take 25 mg by mouth 2 (two) times daily as needed. 04/21/21   [provider]   Allergies    Heparin and Penicillins  Review of Systems   Review of Systems  Constitutional:  Negative for chills and fever.  HENT:  Negative for ear pain and sore throat.   Eyes:  Negative for pain and visual disturbance.  Respiratory:  Negative for cough and shortness of breath.   Cardiovascular:  Negative for chest pain and palpitations.  Gastrointestinal:  Negative for abdominal pain and vomiting.  Genitourinary:  Negative for dysuria and hematuria.  Musculoskeletal:  Positive for arthralgias and neck pain. Negative for back pain.  Skin:  Negative for color change and rash.  Neurological:  Negative for seizures and syncope.  All other systems reviewed and are negative.  Physical Exam Updated Vital Signs BP 109/65   Pulse 91   Temp (!) 97.4 F (36.3 C) (Oral)   Resp 14   Ht 5\' 4"  (1.626 m)   Wt 84 kg   SpO2 94%   BMI 31.79 kg/m   Physical Exam Vitals and nursing note reviewed.  Constitutional:      General: She is not in acute distress.    Appearance: Normal appearance. She is well-developed and normal weight. She is not ill-appearing or toxic-appearing.  HENT:     Head: Normocephalic and atraumatic.  Eyes:     General: No scleral icterus.    Conjunctiva/sclera: Conjunctivae normal.  Neck:     Comments: L paraspinal TTP, no midline Cardiovascular:     Rate and Rhythm: Normal rate and regular rhythm.     Heart sounds: No murmur heard. Pulmonary:     Effort: Pulmonary effort is normal. No respiratory distress.     Breath sounds: Normal breath sounds.  Abdominal:     Palpations: Abdomen is soft.     Tenderness: There is no abdominal tenderness.  Musculoskeletal:     Cervical back: Normal range of motion and neck supple. Tenderness present. No rigidity.  Skin:    General: Skin is warm and dry.     Capillary  Refill: Capillary refill takes less than 2 seconds.  Neurological:     Mental Status: She is alert. Mental status is at baseline.     Motor: No weakness.    ED Results / Procedures / Treatments   Labs (all labs ordered are listed, but only abnormal results are displayed) Labs Reviewed  CBC WITH DIFFERENTIAL/PLATELET - Abnormal; Notable for the following components:      Result Value   RBC 3.72 (*)    Hemoglobin 11.6 (*)    MCV 100.3 (*)    RDW 19.9 (*)    Platelets 101 (*)    All other components within normal limits  COMPREHENSIVE METABOLIC PANEL - Abnormal; Notable for the following components:  Glucose, Bld 103 (*)    BUN 42 (*)    Creatinine, Ser 3.45 (*)    Calcium 8.5 (*)    Albumin 3.1 (*)    GFR, Estimated 14 (*)    All other components within normal limits  I-STAT CHEM 8, ED - Abnormal; Notable for the following components:   BUN 42 (*)    Creatinine, Ser 3.50 (*)    Glucose, Bld 101 (*)    Calcium, Ion 0.94 (*)    All other components within normal limits  PROTIME-INR    EKG None  Radiology CT ABDOMEN PELVIS WO CONTRAST  Result Date: 05/07/2021 CLINICAL DATA:  Fall, renal disease, level 2 trauma EXAM: CT ABDOMEN AND PELVIS WITHOUT CONTRAST TECHNIQUE: Multidetector CT imaging of the abdomen and pelvis was performed following the standard protocol without IV contrast. COMPARISON:  CT abdomen pelvis 04/03/2021 FINDINGS: Lower chest: Small bilateral pleural effusions. Large heart with mitral annular calcifications. Small pericardial effusion. Hepatobiliary: Scattered hyperdense lesions in the liver and multiple small hypodense liver lesions, poorly evaluated out intravenous contrast, similar in distribution and appearance in comparison to recent CT in July. Nonspecific gallbladder wall thickening. No intra or extrahepatic biliary ductal dilation. Pancreas: Unremarkable. No pancreatic ductal dilatation or surrounding inflammatory changes. Spleen: Normal in size without  focal abnormality. Adrenals/Urinary Tract: Adrenal glands are unremarkable. Atrophic kidneys with postprocedural changes involving the lower pole the left kidney. Unchanged right lower pole renal cysts. No hydronephrosis. The bladder is minimally distended. Stomach/Bowel: The stomach is within normal limits. There is no evidence of bowel obstruction. The appendix is normal. Colonic diverticulosis without evidence of diverticulitis. Vascular/Lymphatic: Aortoiliac atherosclerotic calcifications. No AAA. No lymphadenopathy. Reproductive: Prior hysterectomy. Other: Increased, small volume abdominopelvic ascites. Diffuse body wall edema. Small fat containing umbilical hernia. There is a lobular peritoneal mass along the left peripheral hemiabdomen measuring 5.7 x 3.6 cm (series 3, image 30). This previously demonstrated enhancement on prior exam in February 2022. Musculoskeletal: No acute osseous abnormality. Multilevel degenerative changes of the spine. IMPRESSION: No evidence of acute trauma in the abdomen or pelvis. Unchanged appearance and distribution of multiple hyperdense and hypodense liver lesions which are compatible with multiple treated and possibly small residual metastases. Unchanged lobular peritoneal metastasis in the peripheral left hemiabdomen. Increased, small volume abdominopelvic ascites. Nonspecific gallbladder wall thickening, which could be related to hepatocellular disease. Small bilateral pleural effusions.  Small pericardial effusion. Electronically Signed   By: Maurine Simmering M.D.   On: 05/07/2021 18:22   DG Chest 1 View  Result Date: 05/07/2021 CLINICAL DATA:  Trauma. No additional history provided or available. EXAM: CHEST  1 VIEW COMPARISON:  Radiograph and CT 04/03/2021 FINDINGS: Chronic cardiomegaly. Aortic atherosclerosis. Slight vascular congestion. No pneumothorax or focal airspace disease. No pleural fluid. No acute osseous abnormalities are seen. The bones are under mineralized.  IMPRESSION: Chronic cardiomegaly with slight vascular congestion. No traumatic injury. Electronically Signed   By: Keith Rake M.D.   On: 05/07/2021 17:50   CT HEAD WO CONTRAST (5MM)  Result Date: 05/07/2021 CLINICAL DATA:  Level 2 trauma. Pt fell. Takes blood thinners. Pt c/o abdominal pain. AMS EXAM: CT HEAD WITHOUT CONTRAST CT CERVICAL SPINE WITHOUT CONTRAST TECHNIQUE: Multidetector CT imaging of the head and cervical spine was performed following the standard protocol without intravenous contrast. Multiplanar CT image reconstructions of the cervical spine were also generated. COMPARISON:  None. FINDINGS: CT HEAD FINDINGS Brain: Cerebral ventricle sizes are concordant with the degree of cerebral volume loss. Patchy and  confluent areas of decreased attenuation are noted throughout the deep and periventricular white matter of the cerebral hemispheres bilaterally, compatible with chronic microvascular ischemic disease. No evidence of large-territorial acute infarction. No parenchymal hemorrhage. No mass lesion. No extra-axial collection. No mass effect or midline shift. No hydrocephalus. Basilar cisterns are patent. Vascular: No hyperdense vessel. Atherosclerotic calcifications are present within the cavernous internal carotid and vertebral arteries. Skull: No acute fracture or focal lesion. Sinuses/Orbits: Paranasal sinuses and mastoid air cells are clear. The orbits are unremarkable. Other: Left parietooccipital scalp subcutaneus soft tissue edema and emphysema within associated 6 mm scalp hematoma formation. CT CERVICAL SPINE FINDINGS Alignment: Normal. Skull base and vertebrae: Multilevel degenerative changes of the spine. Associated severe osseous neural foraminal stenosis at multiple levels. No severe osseous central canal stenosis peer no acute fracture. No aggressive appearing focal osseous lesion or focal pathologic process. Soft tissues and spinal canal: No prevertebral fluid or swelling. No  visible canal hematoma. Upper chest: Unremarkable. Other: None. IMPRESSION: 1. No acute intracranial abnormality. 2. Left parietal scalp 6 mm hematoma and associated emphysema with no definite underlying calvarial skull fracture. 3. No acute displaced fracture or traumatic listhesis of the cervical spine. 4. Multilevel degenerative changes of the spine with severe osseous neural foraminal stenosis. Electronically Signed   By: Iven Finn M.D.   On: 05/07/2021 18:15   CT Cervical Spine Wo Contrast  Result Date: 05/07/2021 CLINICAL DATA:  Level 2 trauma. Pt fell. Takes blood thinners. Pt c/o abdominal pain. AMS EXAM: CT HEAD WITHOUT CONTRAST CT CERVICAL SPINE WITHOUT CONTRAST TECHNIQUE: Multidetector CT imaging of the head and cervical spine was performed following the standard protocol without intravenous contrast. Multiplanar CT image reconstructions of the cervical spine were also generated. COMPARISON:  None. FINDINGS: CT HEAD FINDINGS Brain: Cerebral ventricle sizes are concordant with the degree of cerebral volume loss. Patchy and confluent areas of decreased attenuation are noted throughout the deep and periventricular white matter of the cerebral hemispheres bilaterally, compatible with chronic microvascular ischemic disease. No evidence of large-territorial acute infarction. No parenchymal hemorrhage. No mass lesion. No extra-axial collection. No mass effect or midline shift. No hydrocephalus. Basilar cisterns are patent. Vascular: No hyperdense vessel. Atherosclerotic calcifications are present within the cavernous internal carotid and vertebral arteries. Skull: No acute fracture or focal lesion. Sinuses/Orbits: Paranasal sinuses and mastoid air cells are clear. The orbits are unremarkable. Other: Left parietooccipital scalp subcutaneus soft tissue edema and emphysema within associated 6 mm scalp hematoma formation. CT CERVICAL SPINE FINDINGS Alignment: Normal. Skull base and vertebrae: Multilevel  degenerative changes of the spine. Associated severe osseous neural foraminal stenosis at multiple levels. No severe osseous central canal stenosis peer no acute fracture. No aggressive appearing focal osseous lesion or focal pathologic process. Soft tissues and spinal canal: No prevertebral fluid or swelling. No visible canal hematoma. Upper chest: Unremarkable. Other: None. IMPRESSION: 1. No acute intracranial abnormality. 2. Left parietal scalp 6 mm hematoma and associated emphysema with no definite underlying calvarial skull fracture. 3. No acute displaced fracture or traumatic listhesis of the cervical spine. 4. Multilevel degenerative changes of the spine with severe osseous neural foraminal stenosis. Electronically Signed   By: Iven Finn M.D.   On: 05/07/2021 18:15   DG Pelvis Portable  Result Date: 05/07/2021 CLINICAL DATA:  Trauma. No additional history provided or available. EXAM: PORTABLE PELVIS 1-2 VIEWS COMPARISON:  None. FINDINGS: No evidence of pelvic fracture. Intertrochanteric left femur is not well assessed due to positioning. No evidence of pubic  symphyseal or sacroiliac joint diastasis. Sclerosis about both sacroiliac joints appears chronic. Pubic rami are intact. Femoral heads are seated in the acetabulum. Soft tissue attenuation from habitus partially obscures detailed assessment. IMPRESSION: Technically limited exam.  No evidence of pelvic fracture. Electronically Signed   By: Keith Rake M.D.   On: 05/07/2021 17:49    Procedures Procedures   Medications Ordered in ED Medications  fentaNYL (SUBLIMAZE) injection 50 mcg (50 mcg Intravenous Given 05/07/21 1854)    ED Course  I have reviewed the triage vital signs and the nursing notes.  Pertinent labs & imaging results that were available during my care of the patient were reviewed by me and considered in my medical decision making (see chart for details).    MDM Rules/Calculators/A&P                            70 year old female with above past medical history presenting to the emergency department following a mechanical fall after dialysis.  Vital signs reviewed, within acceptable limits.  Physical exam is notable for tenderness over the left cervical spine.  The patient does not always answer questions appropriately or know the year, but after discussion with her legal guardian this is the patient's neurologic baseline.  Will obtain a trauma screening chest and pelvis x-ray.  Will obtain a CT of the head and C-spine, as well as the abdomen pelvis as the patient expressed some generalized abdominal discomfort although she did not have any tenderness to my exam.  We will also obtain basic trauma blood work.  CT of the head, cervical spine, abdomen pelvis negative for acute traumatic injury.  Chest and pelvis x-ray also reassuring.  Blood work is largely the patient's baseline.  Given the reassuring work-up, believe that she is stable for discharge from the emergency department this time.  I called the patient's son, and he is comfortable with her coming home and will come pick her up.  Strict return precautions discussed with the son over the phone. Final Clinical Impression(s) / ED Diagnoses Final diagnoses:  Fall, initial encounter    Rx / DC Orders ED Discharge Orders     None        Claud Kelp, MD 05/08/21 Sonya Beck    Malvin Johns, MD 05/08/21 [phone]

## 2021-05-07 NOTE — Discharge Instructions (Addendum)
Your blood work and imaging here was reassuring.  We did not see any evidence of sure to your skull, neck, ribs, or hips.  Please follow-up with your primary care doctor.  I would recommend taking Tylenol as needed for pain at home as oftentimes contusions can hurt worse the next day and the day after.

## 2021-05-08 NOTE — ED Triage Notes (Signed)
Pt BIB [facility] as a lvl 2 fall on blood thinner. Patient was leaving dialysis today after completing full session, became dizzy and fell. Pt complaining of L sided hip pain, chest wall pain and left sided neck pain. Pt presents Aox4. Exhibits large hematoma to posterior aspect of head. No other external injuries noted. VSS at this time. Patient placed in [place] c-collar upon arrival.

## 2021-05-19 ENCOUNTER — Emergency Department (HOSPITAL_COMMUNITY): Payer: Medicare Other

## 2021-05-19 ENCOUNTER — Emergency Department (HOSPITAL_COMMUNITY)
Admission: EM | Admit: 2021-05-19 | Discharge: 2021-05-19 | Disposition: A | Discharge status: Home / Self Care | Payer: Medicare Other | Attending: Emergency Medicine | Admitting: Emergency Medicine

## 2021-05-19 ENCOUNTER — Encounter (HOSPITAL_COMMUNITY): Payer: Self-pay | Admitting: Student

## 2021-05-19 ENCOUNTER — Other Ambulatory Visit: Payer: Self-pay

## 2021-05-19 DIAGNOSIS — M25551 Pain in right hip: Secondary | ICD-10-CM | POA: Diagnosis present

## 2021-05-19 DIAGNOSIS — Z85528 Personal history of other malignant neoplasm of kidney: Secondary | ICD-10-CM | POA: Insufficient documentation

## 2021-05-19 DIAGNOSIS — R600 Localized edema: Secondary | ICD-10-CM | POA: Diagnosis not present

## 2021-05-19 DIAGNOSIS — Z992 Dependence on renal dialysis: Secondary | ICD-10-CM | POA: Insufficient documentation

## 2021-05-19 DIAGNOSIS — D631 Anemia in chronic kidney disease: Secondary | ICD-10-CM | POA: Diagnosis not present

## 2021-05-19 DIAGNOSIS — N186 End stage renal disease: Secondary | ICD-10-CM

## 2021-05-19 DIAGNOSIS — Z7901 Long term (current) use of anticoagulants: Secondary | ICD-10-CM | POA: Diagnosis not present

## 2021-05-19 DIAGNOSIS — R519 Headache, unspecified: Secondary | ICD-10-CM | POA: Insufficient documentation

## 2021-05-19 DIAGNOSIS — Z87891 Personal history of nicotine dependence: Secondary | ICD-10-CM | POA: Diagnosis not present

## 2021-05-19 DIAGNOSIS — W06XXXA Fall from bed, initial encounter: Secondary | ICD-10-CM | POA: Diagnosis not present

## 2021-05-19 DIAGNOSIS — R10817 Generalized abdominal tenderness: Secondary | ICD-10-CM | POA: Diagnosis not present

## 2021-05-19 DIAGNOSIS — Z8542 Personal history of malignant neoplasm of other parts of uterus: Secondary | ICD-10-CM | POA: Insufficient documentation

## 2021-05-19 DIAGNOSIS — I120 Hypertensive chronic kidney disease with stage 5 chronic kidney disease or end stage renal disease: Secondary | ICD-10-CM | POA: Diagnosis not present

## 2021-05-19 DIAGNOSIS — R410 Disorientation, unspecified: Secondary | ICD-10-CM | POA: Diagnosis not present

## 2021-05-19 DIAGNOSIS — R5381 Other malaise: Secondary | ICD-10-CM | POA: Insufficient documentation

## 2021-05-19 DIAGNOSIS — W19XXXA Unspecified fall, initial encounter: Secondary | ICD-10-CM

## 2021-05-19 LAB — CBC WITH DIFFERENTIAL/PLATELET
Abs Immature Granulocytes: 0.02 10*3/uL (ref 0.00–0.07)
Basophils Absolute: 0 10*3/uL (ref 0.0–0.1)
Basophils Relative: 1 %
Eosinophils Absolute: 0.1 10*3/uL (ref 0.0–0.5)
Eosinophils Relative: 1 %
HCT: 38.1 % (ref 36.0–46.0)
Hemoglobin: 12 g/dL (ref 12.0–15.0)
Immature Granulocytes: 0 %
Lymphocytes Relative: 24 %
Lymphs Abs: 1.1 10*3/uL (ref 0.7–4.0)
MCH: 32.3 pg (ref 26.0–34.0)
MCHC: 31.5 g/dL (ref 30.0–36.0)
MCV: 102.7 fL — ABNORMAL HIGH (ref 80.0–100.0)
Monocytes Absolute: 0.3 10*3/uL (ref 0.1–1.0)
Monocytes Relative: 5 %
Neutro Abs: 3.2 10*3/uL (ref 1.7–7.7)
Neutrophils Relative %: 69 %
Platelets: 108 10*3/uL — ABNORMAL LOW (ref 150–400)
RBC: 3.71 MIL/uL — ABNORMAL LOW (ref 3.87–5.11)
RDW: 20.3 % — ABNORMAL HIGH (ref 11.5–15.5)
WBC: 4.7 10*3/uL (ref 4.0–10.5)
nRBC: 0 % (ref 0.0–0.2)

## 2021-05-19 LAB — COMPREHENSIVE METABOLIC PANEL
ALT: 23 U/L (ref 0–44)
AST: 26 U/L (ref 15–41)
Albumin: 3.4 g/dL — ABNORMAL LOW (ref 3.5–5.0)
Alkaline Phosphatase: 79 U/L (ref 38–126)
Anion gap: 17 — ABNORMAL HIGH (ref 5–15)
BUN: 65 mg/dL — ABNORMAL HIGH (ref 8–23)
CO2: 26 mmol/L (ref 22–32)
Calcium: 10.4 mg/dL — ABNORMAL HIGH (ref 8.9–10.3)
Chloride: 98 mmol/L (ref 98–111)
Creatinine, Ser: 5.03 mg/dL — ABNORMAL HIGH (ref 0.44–1.00)
GFR, Estimated: 9 mL/min — ABNORMAL LOW (ref >60)
Glucose, Bld: 122 mg/dL — ABNORMAL HIGH (ref 70–99)
Potassium: 4 mmol/L (ref 3.5–5.1)
Sodium: 141 mmol/L (ref 135–145)
Total Bilirubin: 1.6 mg/dL — ABNORMAL HIGH (ref 0.3–1.2)
Total Protein: 7.2 g/dL (ref 6.5–8.1)

## 2021-05-19 LAB — PROTIME-INR
INR: 1.2 (ref 0.8–1.2)
Prothrombin Time: 15.2 seconds (ref 11.4–15.2)

## 2021-05-19 LAB — APTT: aPTT: 29 seconds (ref 24–36)

## 2021-05-19 MED ORDER — OXYCODONE-ACETAMINOPHEN 5-325 MG PO TABS
1.0000 | ORAL_TABLET | Freq: Once | ORAL | Status: AC
Start: 2021-05-19 — End: 2021-05-19
  Administered 2021-05-19: 1 via ORAL
  Filled 2021-05-19: qty 1

## 2021-05-19 NOTE — ED Notes (Addendum)
..  Trauma Response Nurse Note-  Reason for Call / Reason for Trauma activation:   Level 2 fall on thinners Initial Focused Assessment (If applicable, or please see trauma documentation):  Pt already in dept when level 2 was activated. Pt states that she "slid out of her bed onto her bottom" c/o pain in coccyx and right hip, but pain varies with questions. Lives at home with son. On arrival pt was incontinent of urine/stool. ADLs completed by primary RN and EMT.   Interventions: IV started- 20 g RAC by primary RN Transported to CT with TRN-then to xray for plain films.    Rolene Arbour, RN BSN Trauma Response Nurse [phone]

## 2021-05-19 NOTE — ED Triage Notes (Signed)
Pt bib [facility] from home where she lives with her son, with complaints of a fall out of the bed this am. Pt states that she rolled over and fell on the floor. Pt originally says that her Right hip and leg hurt. But now complaining of a headache. No external injuries noted. Denies loc EMS vitals: 110/62 BP, 64 HR, 172 CBG

## 2021-05-19 NOTE — ED Notes (Signed)
Pt walked to doorway of room and back to bed with 2 person assist.

## 2021-05-19 NOTE — Progress Notes (Signed)
Orthopedic Tech Progress Note Patient Details:  Sonya Beck 1951/07/27 [id number]  Level 2 trauma  Patient ID: Sonya Beck, female   DOB: 1951-05-12, 70 y.o.   MRN: [id number]  Sonya Beck 05/19/2021, 8:38 AM

## 2021-05-19 NOTE — ED Provider Notes (Signed)
[facility] EMERGENCY DEPARTMENT Provider Note   CSN: [id number] Arrival date & time: 05/19/21  9417     History Chief Complaint  Patient presents with   Lytle Michaels    Alleen Kehm is a 70 y.o. 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 Patient states she slid out of bed by accident striking her head and falling to the floor. She denies LOC. Having pain in her head and right hip. She provides variable answers to questions. I called & spoke with her son whom she lives with who relays fall was unwitnessed, he states patient has had a poor state of health over the past 3 weeks with malaise, recurrent falls, confusion and angry outbursts has been similar over past few days, no acute change today. She last dialyzed Saturday- is due for dialysis today. He has not noted her having vomiting, diarrhea, cough, or chest pain.   In terms of her RCC- seen by oncology 05/04/21- CT scan in July showed a positive response to therapy after 1 cycle of immunotherapy, decision made to hold on further tx at this time and observe given her lack of tolerability to prior therapy and her overall poor health status, possible re-institution of therapy following observation period pending improvement in overall health.   HPI     Past Medical History:  Diagnosis Date   Cancer [facility])    Endometrial adenocarcinoma ([place]) 04/17/2018   Fibromyalgia 03/18/2021   Gastroesophageal reflux disease without esophagitis 03/18/2021   History of blood transfusion    History of DVT of lower extremity 03/18/2021   History of stroke 03/18/2021   Metastatic renal cell carcinoma to liver ([place]) 03/18/2021   Severe aortic stenosis 03/18/2021   Stroke [facility])     Patient Active Problem List   Diagnosis Date Noted   Intractable vomiting with nausea,  unspecified vomiting type 04/07/2021   Intractable nausea and vomiting 04/06/2021   Epigastric pain    Encounter to establish care 03/18/2021   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     Family History  Problem Relation Age of Onset   Heart disease Neg Hx     Social History   Tobacco Use   Smoking status: Former    Types: Cigarettes   Smokeless tobacco: Never  Vaping Use   Vaping Use: Never used  Substance Use Topics   Alcohol use: Not Currently   Drug use: Never    Home Medications Prior to Admission medications   Medication Sig Start Date End Date Taking? Authorizing Provider  apixaban (ELIQUIS) 5 MG TABS tablet Take 1 tablet (5 mg total) by mouth 2 (two) times daily. 03/18/21   Ronnell Freshwater, NP  cabozantinib (CABOMETYX) 40 MG tablet Take 1 tablet (40  mg total) by mouth daily. Take on an empty stomach, 1 hour before or 2 hours after meals. 04/13/21   Wyatt Portela, MD  cabozantinib (CABOMETYX) 40 MG tablet Take by mouth. 04/21/21   [provider]  iron sucrose in sodium chloride 0.9 % 100 mL Iron Sucrose (Venofer) 04/16/21 04/08/22  [provider]  ondansetron (ZOFRAN) 4 MG tablet Take 1 tablet (4 mg total) by mouth every 6 (six) hours as needed for nausea. 04/08/21   Little Ishikawa, MD  ondansetron (ZOFRAN) 4 MG tablet Take by mouth. 04/21/21   [provider]  pantoprazole (PROTONIX)  40 MG tablet Take 1 tablet (40 mg total) by mouth daily. 03/18/21   Ronnell Freshwater, NP  pantoprazole (PROTONIX) 40 MG tablet Take by mouth. 04/21/21   [provider]  promethazine (PHENERGAN) 12.5 MG suppository Place 1 suppository (12.5 mg total) rectally every 6 (six) hours as needed for refractory nausea / vomiting. 04/08/21   Little Ishikawa, MD  promethazine (PHENERGAN) 25 MG tablet Take 25 mg by mouth 2 (two) times daily as needed. 04/21/21   [provider]    Allergies    Heparin and Penicillins  Review of Systems   Review of Systems  Constitutional:  Negative for fever.  Respiratory:  Negative for cough and shortness of breath.   Gastrointestinal:  Negative for abdominal pain and vomiting.  Musculoskeletal:  Positive for arthralgias.  Neurological:  Positive for headaches. Negative for syncope.  All other systems reviewed and are negative.  Physical Exam Updated Vital Signs BP 111/79   Pulse 78   Temp 97.7 F (36.5 C) (Oral)   Resp 20   Ht 5\' 4"  (1.626 m)   Wt 84 kg   SpO2 100%   BMI 31.79 kg/m   Physical Exam Vitals and nursing note reviewed.  Constitutional:      Appearance: She is ill-appearing (chronically). She is not toxic-appearing.  HENT:     Head:     Comments: No racoon eyes or battle sign.  Eyes:     Pupils: Pupils are equal, round, and reactive to light.  Cardiovascular:     Rate and Rhythm: Normal rate and regular rhythm.  Pulmonary:     Effort: Pulmonary effort is normal. No respiratory distress.     Comments: Decreased breath sounds @ the bases.  Chest:     Chest wall: Tenderness (anterior chest wall without overlying ecchymosis, abrasions or palpable crepitus) present.  Abdominal:     General: There is no distension.     Palpations: Abdomen is soft.     Tenderness: There is abdominal tenderness (mild generalized). There is no guarding or rebound.  Musculoskeletal:     Cervical back: Normal range of motion and neck  supple. No tenderness.     Comments: Ues: Intact ROM throughout major joints. No focal bony tenderness.  Back; No midline tenderness.  Les: pitting edema to dorsal feet. LLE -Able to lift off of the stretcher and fully flex/extend the knee. RLE- unable to lift off of the stretcher actively, can bend the knee some. Tender to the right hip, knee, lower leg and dorsal foot. Otherwise no focal tenderness. Compartments are soft.   Skin:    General: Skin is warm and dry.     Comments: Scabs noted to the shoulders.   Neurological:     Mental Status: She is alert.     Comments: Alert. Clear speech.  Sensation grossly tact bilateral upper and  lower extremities.  5-5 symmetric grip strength and strength with plantar dorsiflexion bilaterally.    ED Results / Procedures / Treatments   Labs (all labs ordered are listed, but only abnormal results are displayed) Labs Reviewed  COMPREHENSIVE METABOLIC PANEL - Abnormal; Notable for the following components:      Result Value   Glucose, Bld 122 (*)    BUN 65 (*)    Creatinine, Ser 5.03 (*)    Calcium 10.4 (*)    Albumin 3.4 (*)    Total Bilirubin 1.6 (*)    GFR, Estimated 9 (*)    Anion gap 17 (*)    All other components within normal limits  CBC WITH DIFFERENTIAL/PLATELET - Abnormal; Notable for the following components:   RBC 3.71 (*)    MCV 102.7 (*)    RDW 20.3 (*)    Platelets 108 (*)    All other components within normal limits  PROTIME-INR  APTT  URINALYSIS, ROUTINE W REFLEX MICROSCOPIC    EKG None  Radiology DG Tibia/Fibula Right  Result Date: 05/19/2021 CLINICAL DATA:  Fall with pain. EXAM: RIGHT TIBIA AND FIBULA - 2 VIEW COMPARISON:  None. FINDINGS: Study reviewed along with the knee exam performed at the same time. No evidence for an acute fracture. No worrisome lytic or sclerotic osseous abnormality. IMPRESSION: Negative. Electronically Signed   By: Misty Stanley M.D.   On: 05/19/2021 09:35   CT Head Wo Contrast  Result  Date: 05/19/2021 CLINICAL DATA:  Level 2 fall.  Trauma. EXAM: CT HEAD WITHOUT CONTRAST CT CERVICAL SPINE WITHOUT CONTRAST TECHNIQUE: Multidetector CT imaging of the head and cervical spine was performed following the standard protocol without intravenous contrast. Multiplanar CT image reconstructions of the cervical spine were also generated. COMPARISON:  05/07/2021 FINDINGS: CT HEAD FINDINGS Brain: There is no evidence for acute hemorrhage, hydrocephalus, mass lesion, or abnormal extra-axial fluid collection. No definite CT evidence for acute infarction. Diffuse loss of parenchymal volume is consistent with atrophy. Patchy low attenuation in the deep hemispheric and periventricular white matter is nonspecific, but likely reflects chronic microvascular ischemic demyelination. Small area of encephalomalacia in the right MCA territory is stable, consistent with old infarct. Vascular: No hyperdense vessel or unexpected calcification. Skull: No evidence for fracture. No worrisome lytic or sclerotic lesion. Sinuses/Orbits: The visualized paranasal sinuses and mastoid air cells are clear. Visualized portions of the globes and intraorbital fat are unremarkable. Other: None. CT CERVICAL SPINE FINDINGS Alignment: Straightening of normal cervical lordosis without subluxation. Skull base and vertebrae: No acute fracture. No primary bone lesion or focal pathologic process. Soft tissues and spinal canal: 9 mm right thyroid nodule. Not clinically significant; no follow-up imaging recommended (ref: J Am Coll Radiol. 2015 Feb;12(2): 143-50). Disc levels: Loss of disc height noted at C4-5, C5-6, C6-7, and C7-T1 with endplate degeneration associated. Upper chest: Unremarkable. Other: None. IMPRESSION: 1. No acute intracranial abnormality. Atrophy with chronic small vessel white matter ischemic disease. Old right MCA territory infarct. 2. Degenerative changes in the cervical spine without fracture. 3. Loss of cervical lordosis. This  can be related to patient positioning, muscle spasm or soft tissue injury. Electronically Signed   By: Misty Stanley M.D.   On: 05/19/2021 09:08   CT Cervical Spine Wo Contrast  Result Date: 05/19/2021 CLINICAL DATA:  Level 2 fall.  Trauma. EXAM: CT HEAD WITHOUT CONTRAST CT CERVICAL SPINE WITHOUT CONTRAST TECHNIQUE: Multidetector CT imaging of the head and cervical spine was performed following the standard protocol without intravenous  contrast. Multiplanar CT image reconstructions of the cervical spine were also generated. COMPARISON:  05/07/2021 FINDINGS: CT HEAD FINDINGS Brain: There is no evidence for acute hemorrhage, hydrocephalus, mass lesion, or abnormal extra-axial fluid collection. No definite CT evidence for acute infarction. Diffuse loss of parenchymal volume is consistent with atrophy. Patchy low attenuation in the deep hemispheric and periventricular white matter is nonspecific, but likely reflects chronic microvascular ischemic demyelination. Small area of encephalomalacia in the right MCA territory is stable, consistent with old infarct. Vascular: No hyperdense vessel or unexpected calcification. Skull: No evidence for fracture. No worrisome lytic or sclerotic lesion. Sinuses/Orbits: The visualized paranasal sinuses and mastoid air cells are clear. Visualized portions of the globes and intraorbital fat are unremarkable. Other: None. CT CERVICAL SPINE FINDINGS Alignment: Straightening of normal cervical lordosis without subluxation. Skull base and vertebrae: No acute fracture. No primary bone lesion or focal pathologic process. Soft tissues and spinal canal: 9 mm right thyroid nodule. Not clinically significant; no follow-up imaging recommended (ref: J Am Coll Radiol. 2015 Feb;12(2): 143-50). Disc levels: Loss of disc height noted at C4-5, C5-6, C6-7, and C7-T1 with endplate degeneration associated. Upper chest: Unremarkable. Other: None. IMPRESSION: 1. No acute intracranial abnormality. Atrophy  with chronic small vessel white matter ischemic disease. Old right MCA territory infarct. 2. Degenerative changes in the cervical spine without fracture. 3. Loss of cervical lordosis. This can be related to patient positioning, muscle spasm or soft tissue injury. Electronically Signed   By: Misty Stanley M.D.   On: 05/19/2021 09:08   DG Pelvis Portable  Result Date: 05/19/2021 CLINICAL DATA:  Fall on blood thinners EXAM: PORTABLE PELVIS 1-2 VIEWS COMPARISON:  None. FINDINGS: Limited AP pelvis with rotated, foreshortened femoral necks and soft-tissue attenuation degrading detail. No evidence of fracture or dislocation. Lumbar spine degeneration. Bilateral sacroiliac osteoarthritis. IMPRESSION: Limited portable pelvis without acute finding. Electronically Signed   By: Monte Fantasia M.D.   On: 05/19/2021 08:51   DG Chest Portable 1 View  Result Date: 05/19/2021 CLINICAL DATA:  Fall on blood thinners EXAM: PORTABLE CHEST 1 VIEW COMPARISON:  05/07/2021 FINDINGS: Cardiopericardial enlargement with vascular congestion. No visible effusion or pneumothorax. Extensive artifact from EKG leads. No visible fracture IMPRESSION: Cardiomegaly and vascular congestion. Electronically Signed   By: Monte Fantasia M.D.   On: 05/19/2021 08:49   DG Knee Complete 4 Views Right  Result Date: 05/19/2021 CLINICAL DATA:  Fall.  Pain. EXAM: RIGHT KNEE - COMPLETE 4+ VIEW COMPARISON:  No comparison studies available. FINDINGS: No fracture or dislocation. No worrisome lytic or sclerotic osseous abnormality. No evidence for joint effusion. Mild hypertrophic spurring noted all 3 compartments. IMPRESSION: Degenerative changes without acute bony abnormality. Electronically Signed   By: Misty Stanley M.D.   On: 05/19/2021 09:34   DG Foot Complete Right  Result Date: 05/19/2021 CLINICAL DATA:  Fall, pain, blood thinners EXAM: RIGHT FOOT COMPLETE - 3+ VIEW COMPARISON:  None. FINDINGS: No fracture or dislocation of the right foot. Joint  spaces are well preserved. Vascular calcinosis. Diffuse soft tissue edema about the foot and ankle. IMPRESSION: No fracture or dislocation of the right foot. Diffuse soft tissue edema. Electronically Signed   By: Eddie Candle M.D.   On: 05/19/2021 09:34   DG Hip Unilat With Pelvis 2-3 Views Right  Result Date: 05/19/2021 CLINICAL DATA:  Fall.  Leg pain.  Hip pain. EXAM: DG HIP (WITH OR WITHOUT PELVIS) 2-3V RIGHT COMPARISON:  None. FINDINGS: Bones are diffusely demineralized. Although assessment is limited by  positioning, there appears to be a nondisplaced fracture of the right femoral neck. No other acute fracture evident. IMPRESSION: Probable nondisplaced right femoral neck fracture. CT imaging could be used to further evaluate. Electronically Signed   By: Misty Stanley M.D.   On: 05/19/2021 09:33   CT CHEST ABDOMEN PELVIS WO CONTRAST  Result Date: 05/19/2021 CLINICAL DATA:  Chest trauma, mod-severe EXAM: CT CHEST, ABDOMEN AND PELVIS WITHOUT CONTRAST TECHNIQUE: Multidetector CT imaging of the chest, abdomen and pelvis was performed following the standard protocol without IV contrast. COMPARISON:  August 2022, July 2022 FINDINGS: CT CHEST Cardiovascular: The thoracic aorta is normal in caliber. Aortic atherosclerosis. The heart is enlarged. Trace pericardial effusion is stable. Coronary artery calcification. Aortic valve calcifications. Mediastinum/Nodes: No enlarged mediastinal, hilar, or axillary lymph nodes. The thyroid gland appears normal. Lungs/Pleura: Compressive atelectasis in the left lung due to patient positioning. No pleural effusion. No pneumothorax. No mass or focal consolidation. No suspicious pulmonary nodules. CT ABDOMEN Hepatobiliary: The liver is normal in size. Again seen is at least one ill-defined hyperdense lesion in the superior aspect of the right hepatic lobe near the hepatic dome. No intrahepatic or extrahepatic biliary ductal dilation. The gallbladder appears normal. Spleen:  Normal in size without focal abnormality. Pancreas: No pancreatic ductal dilatation or surrounding inflammatory changes. Adrenals/Urinary Tract: Adrenal glands are unremarkable. Atrophic appearance of the left kidney. The right kidney demonstrates a stable inferior pole cyst measuring simple fluid attenuation. No hydronephrosis. Bladder is unremarkable. Stomach/Bowel: The stomach, small bowel and large bowel are normal in caliber without abnormal wall thickening or surrounding inflammatory changes. Sigmoid diverticulosis without active inflammatory changes. Reproductive: Status post hysterectomy. No adnexal masses. Lymphatic: No enlarged lymph nodes in the abdomen or pelvis. Vasculature: Diffuse atherosclerotic calcifications. Other: No abdominopelvic ascites. 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 Musculoskeletal: Degenerative changes of the lumbar spine most advanced at L5-S1. No aggressive osseous lesions. Diffuse subcutaneous edema. IMPRESSION: Chest: 1. No acute traumatic findings in the chest. 2. Stable chronic findings described. Abdomen/pelvis: 1. No acute traumatic findings in the abdomen or pelvis. 2. Stable appearance of hepatic lesions and peritoneal implants in the left hemiabdomen as described on recent previous cross-sectional exams. Correlate with history of malignancy and treatment. Electronically Signed   By: Albin Felling M.D.   On: 05/19/2021 11:29    Procedures Procedures   Medications Ordered in ED Medications - No data to display  ED Course  I have reviewed the triage vital signs and the nursing notes.  Pertinent labs & imaging results that were available during my care of the patient were reviewed by me and considered in my medical decision making (see chart for details).    MDM Rules/Calculators/A&P                           Patient presents to the ED with complaints of fall.  She reports head injury, on Eliquis- level 2  trauma activated.  Nontoxic, vitals without significant abnormality, BP mildly soft- hx of same.  Plan for labs & imaging.   Additional history obtained:  Additional history obtained from chart review, EMS, Patient's son & nursing note review.   Lab Tests:  I Ordered, reviewed, and interpreted labs, which included:  CBC: Thrombocytopenia similar to prior.  CMP: consistent w/ ESRD, no hyperkalemia.  PT/INR/APTT: WNL UA ordered, bladder scan without urine, ESRD patient.   Imaging Studies ordered:  I  ordered imaging studies which included CT head & Cspine as well as x-rays of the chest, pelvis, and right hip, knee, tib/fib, and foot, I independently reviewed, formal radiology impression shows:  CT head/Cspine:  CXR: Cardiomegaly and vascular congestion Pelvis x-ray: Limited portable pelvis without acute finding Right hip: Probable nondisplaced right femoral neck fracture. CT imaging could be used to further evaluate Right knee: Degenerative changes without acute bony abnormality Right foot: No fracture or dislocation of the right foot. Diffuse soft tissue edema  ED Course:  With questionable right hip x-ray CT right hip and CT chest abdomen pelvis was ordered, radiology tech called and stated they would get images of the hip on the CT abdomen pelvis. Chest: 1. No acute traumatic findings in the chest. 2. Stable chronic findings described. Abdomen/pelvis: 1. No acute traumatic findings in the abdomen or pelvis. 2. Stable appearance of hepatic lesions and peritoneal implants in the left hemiabdomen as described on recent previous cross-sectional exams. Correlate with history of malignancy and treatment.  I called and spoke with radiologist Dr. Dan Humphreys has reviewed imaging again, states that there is no findings of hip fracture on CT.  Patient with blood work which is similar to prior labs we have on record.  She is without signs of significant traumatic injury on imaging.Marland Kitchen She was given  Percocet for pain, she is tolerating p.o., she is able to ambulate in the emergency department with a walker.   She seems to have had a recent decline in her overall mental and physical health over the past 3 to 4 weeks per discussion with her son, no acute change today per his report, no significant acute abnormalities on her work-up in the emergency department today, she is ambulatory, she overall seems reasonable for discharge, however consult placed to case management to assist with possible home health.  I have also called and discussed with renal navigator Linus Orn as patient missed dialysis today, she does not appear to require emergent dialysis at this time, she is now scheduled for dialysis tomorrow at 5:15 AM.  I discussed results, treatment plan, need for follow-up, and return precautions with the patient and her son @ bedside Provided opportunity for questions, they are in agreement.   This is a shared visit with supervising physician Dr. Armandina Gemma who has independently evaluated patient & provided guidance in evaluation/management/disposition, in agreement with care   Portions of this note were generated with Dragon dictation software. Dictation errors may occur despite best attempts at proofreading.  Final Clinical Impression(s) / ED Diagnoses Final diagnoses:  Fall  ESRD (end stage renal disease) [facility])    Rx / DC Orders ED Discharge Orders     None        Amaryllis Dyke, PA-C 05/21/21 0013    Regan Lemming, MD 05/21/21 [phone]

## 2021-05-19 NOTE — ED Notes (Signed)
DC  isntructions reviewed with pt/family.  Understanding was verbalized.  Pt DC.

## 2021-05-19 NOTE — ED Notes (Signed)
Patient transported to CT 

## 2021-05-19 NOTE — Discharge Planning (Signed)
Lennis Rader J. Clydene Laming, RN, BSN, [place] [phone] RNCM consulted regarding discharge planning for Sonya Beck. Offered pt medicare.gov list of home health agencies to choose from.  Pt chose [facility] to render services. Alyse Low of [facility] notified. Patient made aware that [facility] will be in contact in 24-48 hours.  No DME needs identified at this time.

## 2021-05-19 NOTE — Discharge Instructions (Addendum)
You were seen in the emergency department today after a fall.  Your imaging did not show any acute injuries.  Your labs look similar to prior blood work you have had done.  Given you missed dialysis today we have scheduled for you to go to dialysis tomorrow.  You need to be in the dialysis lobby at 5:15 AM tomorrow morning.  We would like you to follow-up with your primary care provider very closely, call today to schedule soonest available follow-up.  Return to the emergency department for any new or worsening symptoms or any other concerns.

## 2021-05-22 ENCOUNTER — Encounter (HOSPITAL_COMMUNITY): Payer: Self-pay | Admitting: *Deleted

## 2021-05-22 ENCOUNTER — Emergency Department (HOSPITAL_COMMUNITY): Payer: Medicare Other

## 2021-05-22 ENCOUNTER — Other Ambulatory Visit: Payer: Self-pay

## 2021-05-22 ENCOUNTER — Inpatient Hospital Stay (HOSPITAL_COMMUNITY)
Admission: EM | Admit: 2021-05-22 | Discharge: 2021-05-26 | DRG: 064 | Disposition: A | Discharge status: Hospice | Payer: Medicare Other | Attending: Internal Medicine | Admitting: Internal Medicine

## 2021-05-22 DIAGNOSIS — L8990 Pressure ulcer of unspecified site, unspecified stage: Secondary | ICD-10-CM | POA: Diagnosis present

## 2021-05-22 DIAGNOSIS — Z880 Allergy status to penicillin: Secondary | ICD-10-CM

## 2021-05-22 DIAGNOSIS — Z8542 Personal history of malignant neoplasm of other parts of uterus: Secondary | ICD-10-CM

## 2021-05-22 DIAGNOSIS — Z87891 Personal history of nicotine dependence: Secondary | ICD-10-CM

## 2021-05-22 DIAGNOSIS — R4182 Altered mental status, unspecified: Secondary | ICD-10-CM | POA: Diagnosis present

## 2021-05-22 DIAGNOSIS — C799 Secondary malignant neoplasm of unspecified site: Secondary | ICD-10-CM | POA: Diagnosis not present

## 2021-05-22 DIAGNOSIS — Z90710 Acquired absence of both cervix and uterus: Secondary | ICD-10-CM

## 2021-05-22 DIAGNOSIS — Z7901 Long term (current) use of anticoagulants: Secondary | ICD-10-CM

## 2021-05-22 DIAGNOSIS — C787 Secondary malignant neoplasm of liver and intrahepatic bile duct: Secondary | ICD-10-CM | POA: Diagnosis present

## 2021-05-22 DIAGNOSIS — D696 Thrombocytopenia, unspecified: Secondary | ICD-10-CM | POA: Diagnosis present

## 2021-05-22 DIAGNOSIS — I639 Cerebral infarction, unspecified: Principal | ICD-10-CM | POA: Diagnosis present

## 2021-05-22 DIAGNOSIS — R410 Disorientation, unspecified: Secondary | ICD-10-CM | POA: Diagnosis not present

## 2021-05-22 DIAGNOSIS — K219 Gastro-esophageal reflux disease without esophagitis: Secondary | ICD-10-CM | POA: Diagnosis present

## 2021-05-22 DIAGNOSIS — Z20822 Contact with and (suspected) exposure to covid-19: Secondary | ICD-10-CM | POA: Diagnosis present

## 2021-05-22 DIAGNOSIS — Z66 Do not resuscitate: Secondary | ICD-10-CM | POA: Diagnosis not present

## 2021-05-22 DIAGNOSIS — F419 Anxiety disorder, unspecified: Secondary | ICD-10-CM | POA: Diagnosis present

## 2021-05-22 DIAGNOSIS — Z86718 Personal history of other venous thrombosis and embolism: Secondary | ICD-10-CM

## 2021-05-22 DIAGNOSIS — E785 Hyperlipidemia, unspecified: Secondary | ICD-10-CM | POA: Diagnosis present

## 2021-05-22 DIAGNOSIS — L89301 Pressure ulcer of unspecified buttock, stage 1: Secondary | ICD-10-CM | POA: Diagnosis present

## 2021-05-22 DIAGNOSIS — G9341 Metabolic encephalopathy: Secondary | ICD-10-CM | POA: Diagnosis present

## 2021-05-22 DIAGNOSIS — Z888 Allergy status to other drugs, medicaments and biological substances status: Secondary | ICD-10-CM

## 2021-05-22 DIAGNOSIS — C649 Malignant neoplasm of unspecified kidney, except renal pelvis: Secondary | ICD-10-CM | POA: Diagnosis present

## 2021-05-22 DIAGNOSIS — I350 Nonrheumatic aortic (valve) stenosis: Secondary | ICD-10-CM | POA: Diagnosis present

## 2021-05-22 DIAGNOSIS — I509 Heart failure, unspecified: Secondary | ICD-10-CM | POA: Diagnosis present

## 2021-05-22 DIAGNOSIS — Z9181 History of falling: Secondary | ICD-10-CM

## 2021-05-22 DIAGNOSIS — R531 Weakness: Secondary | ICD-10-CM

## 2021-05-22 DIAGNOSIS — D649 Anemia, unspecified: Secondary | ICD-10-CM | POA: Diagnosis present

## 2021-05-22 DIAGNOSIS — M797 Fibromyalgia: Secondary | ICD-10-CM | POA: Diagnosis present

## 2021-05-22 DIAGNOSIS — I6330 Cerebral infarction due to thrombosis of unspecified cerebral artery: Secondary | ICD-10-CM | POA: Diagnosis present

## 2021-05-22 DIAGNOSIS — R0602 Shortness of breath: Secondary | ICD-10-CM

## 2021-05-22 DIAGNOSIS — Z9221 Personal history of antineoplastic chemotherapy: Secondary | ICD-10-CM

## 2021-05-22 DIAGNOSIS — Z515 Encounter for palliative care: Secondary | ICD-10-CM

## 2021-05-22 DIAGNOSIS — D631 Anemia in chronic kidney disease: Secondary | ICD-10-CM | POA: Diagnosis present

## 2021-05-22 DIAGNOSIS — Z905 Acquired absence of kidney: Secondary | ICD-10-CM

## 2021-05-22 DIAGNOSIS — Z79899 Other long term (current) drug therapy: Secondary | ICD-10-CM

## 2021-05-22 DIAGNOSIS — M898X9 Other specified disorders of bone, unspecified site: Secondary | ICD-10-CM | POA: Diagnosis present

## 2021-05-22 DIAGNOSIS — Z8673 Personal history of transient ischemic attack (TIA), and cerebral infarction without residual deficits: Secondary | ICD-10-CM

## 2021-05-22 DIAGNOSIS — N186 End stage renal disease: Secondary | ICD-10-CM

## 2021-05-22 DIAGNOSIS — Z992 Dependence on renal dialysis: Secondary | ICD-10-CM

## 2021-05-22 DIAGNOSIS — I132 Hypertensive heart and chronic kidney disease with heart failure and with stage 5 chronic kidney disease, or end stage renal disease: Secondary | ICD-10-CM | POA: Diagnosis present

## 2021-05-22 DIAGNOSIS — F0390 Unspecified dementia without behavioral disturbance: Secondary | ICD-10-CM | POA: Diagnosis present

## 2021-05-22 DIAGNOSIS — N390 Urinary tract infection, site not specified: Secondary | ICD-10-CM | POA: Diagnosis present

## 2021-05-22 DIAGNOSIS — N2581 Secondary hyperparathyroidism of renal origin: Secondary | ICD-10-CM | POA: Diagnosis present

## 2021-05-22 LAB — CBC
HCT: 35.6 % — ABNORMAL LOW (ref 36.0–46.0)
Hemoglobin: 11.2 g/dL — ABNORMAL LOW (ref 12.0–15.0)
MCH: 32.5 pg (ref 26.0–34.0)
MCHC: 31.5 g/dL (ref 30.0–36.0)
MCV: 103.2 fL — ABNORMAL HIGH (ref 80.0–100.0)
Platelets: 148 10*3/uL — ABNORMAL LOW (ref 150–400)
RBC: 3.45 MIL/uL — ABNORMAL LOW (ref 3.87–5.11)
RDW: 20.2 % — ABNORMAL HIGH (ref 11.5–15.5)
WBC: 4.1 10*3/uL (ref 4.0–10.5)
nRBC: 0 % (ref 0.0–0.2)

## 2021-05-22 LAB — COMPREHENSIVE METABOLIC PANEL
ALT: 21 U/L (ref 0–44)
AST: 25 U/L (ref 15–41)
Albumin: 3.2 g/dL — ABNORMAL LOW (ref 3.5–5.0)
Alkaline Phosphatase: 80 U/L (ref 38–126)
Anion gap: 16 — ABNORMAL HIGH (ref 5–15)
BUN: 68 mg/dL — ABNORMAL HIGH (ref 8–23)
CO2: 30 mmol/L (ref 22–32)
Calcium: 10.3 mg/dL (ref 8.9–10.3)
Chloride: 98 mmol/L (ref 98–111)
Creatinine, Ser: 5.19 mg/dL — ABNORMAL HIGH (ref 0.44–1.00)
GFR, Estimated: 8 mL/min — ABNORMAL LOW (ref >60)
Glucose, Bld: 117 mg/dL — ABNORMAL HIGH (ref 70–99)
Potassium: 4 mmol/L (ref 3.5–5.1)
Sodium: 144 mmol/L (ref 135–145)
Total Bilirubin: 1.1 mg/dL (ref 0.3–1.2)
Total Protein: 6.8 g/dL (ref 6.5–8.1)

## 2021-05-22 LAB — PROTIME-INR
INR: 1.2 (ref 0.8–1.2)
Prothrombin Time: 14.8 seconds (ref 11.4–15.2)

## 2021-05-22 LAB — URINALYSIS, ROUTINE W REFLEX MICROSCOPIC
Bilirubin Urine: NEGATIVE
Glucose, UA: NEGATIVE mg/dL
Ketones, ur: NEGATIVE mg/dL
Nitrite: NEGATIVE
Protein, ur: 100 mg/dL — AB
RBC / HPF: >50 RBC/hpf — ABNORMAL HIGH (ref 0–5)
Specific Gravity, Urine: 1.014 (ref 1.005–1.030)
WBC, UA: >50 WBC/hpf — ABNORMAL HIGH (ref 0–5)
pH: 6 (ref 5.0–8.0)

## 2021-05-22 LAB — LIPASE, BLOOD: Lipase: 84 U/L — ABNORMAL HIGH (ref 11–51)

## 2021-05-22 LAB — CBG MONITORING, ED: Glucose-Capillary: 103 mg/dL — ABNORMAL HIGH (ref 70–99)

## 2021-05-22 LAB — AMMONIA: Ammonia: 12 umol/L (ref 9–35)

## 2021-05-22 MED ORDER — ONDANSETRON HCL 4 MG PO TABS: 4.0000 mg | ORAL_TABLET | Freq: Four times a day (QID) | ORAL | Status: DC | PRN

## 2021-05-22 MED ORDER — APIXABAN 5 MG PO TABS
5.0000 mg | ORAL_TABLET | ORAL | Status: DC
Start: 2021-05-22 — End: 2021-05-22

## 2021-05-22 MED ORDER — APIXABAN 5 MG PO TABS
5.0000 mg | ORAL_TABLET | Freq: Two times a day (BID) | ORAL | Status: DC
  Administered 2021-05-23 (×2): 5 mg via ORAL
  Filled 2021-05-22 (×2): qty 1

## 2021-05-22 NOTE — ED Provider Notes (Signed)
Emergency Medicine Provider Triage Evaluation Note  Sonya Beck , a 70 y.o. female  was evaluated in triage.  Pt complains of altered mental status. Arrived via EMS from home where she lives with her son. Family reports patient has been confused for 2 weeks. Last got dialysis yesterday.   Review of Systems  Positive: Confusion, irritability, dysuria, leg swelling, abdominal pain Negative: Fevers, chills, chest pain, SOB, hematuria, diarrhea, constipation  Physical Exam  BP 96/76 (BP Location: Right Arm)   Pulse 81   Temp 97.9 F (36.6 C) (Oral)   Resp 16   Ht 5\' 4"  (1.626 m)   Wt 84 kg   SpO2 100%   BMI 31.79 kg/m  Gen:   Awake, no distress   Resp:  Normal effort  MSK:   Moves extremities without difficulty  Other:  2+ pitting edema to bilateral legs to level of knees  Medical Decision Making  Medically screening exam initiated at 3:49 PM.  Appropriate orders placed.  Ruberta Holck was informed that the remainder of the evaluation will be completed by another provider, this initial triage assessment does not replace that evaluation, and the importance of remaining in the ED until their evaluation is complete.     Estill Cotta 05/22/21 1550    Wyvonnia Dusky, MD 05/22/21 [phone]

## 2021-05-22 NOTE — ED Provider Notes (Signed)
[facility] EMERGENCY DEPARTMENT Provider Note   CSN: [id number] Arrival date & time: 05/22/21  1539     History Chief Complaint  Patient presents with   Altered Mental Status    Sonya Beck is a 70 y.o. female.   Altered Mental Status  Patient presents to the ED for evaluation of weakness, and confusion.  Patient according to the medical records was recently in the emergency room on August 30.  Patient was evaluated on that day after a fall.  According to the medical records at that time the patient had been having issues with increasing weakness, malaise recurrent falls and confusion over the past few weeks.  Patient is a dialysis patient and has been compliant with her dialysis and last went yesterday.  Patient had an episode today where she felt very weak and could not stand up.  She did not actually fall but did slide to the ground.  Patient feels that her legs are weaker than her arms.  Patient states she has persistent pain in her abdomen.  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  Patient denies any fevers or chills.  No vomiting or diarrhea.  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/2021   History of stroke 03/18/2021   Metastatic renal cell carcinoma to liver ([place]) 03/18/2021   Severe aortic stenosis 03/18/2021   Stroke [facility])     Patient Active Problem List   Diagnosis Date Noted   Intractable vomiting with nausea, unspecified vomiting type 04/07/2021   Intractable nausea and vomiting 04/06/2021   Epigastric pain    Encounter to establish care 03/18/2021   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     Family History  Problem Relation Age of Onset   Heart disease Neg Hx     Social History   Tobacco Use   Smoking status: Former    Types: Cigarettes   Smokeless tobacco: Never  Vaping Use   Vaping Use: Never used  Substance Use Topics   Alcohol use: Not Currently   Drug use: Never    Home Medications Prior to Admission medications   Medication Sig Start Date End Date Taking? Authorizing Provider  apixaban (ELIQUIS) 5 MG TABS tablet Take 1 tablet (5 mg total) by mouth 2 (two) times daily. 03/18/21   Ronnell Freshwater, NP  cabozantinib (CABOMETYX) 40 MG tablet Take 1 tablet (40 mg total) by mouth daily. Take on an empty stomach, 1 hour before or 2 hours after meals. 04/13/21   Wyatt Portela, MD  cabozantinib (CABOMETYX) 40 MG tablet Take by mouth. 04/21/21   [provider]  iron sucrose in sodium chloride  0.9 % 100 mL Iron Sucrose (Venofer) 04/16/21 04/08/22  [provider]  ondansetron (ZOFRAN) 4 MG tablet Take 1 tablet (4 mg total) by mouth every 6 (six) hours as needed for nausea. 04/08/21   Little Ishikawa, MD  ondansetron (ZOFRAN) 4 MG tablet Take by mouth. 04/21/21   [provider]  pantoprazole (PROTONIX) 40 MG tablet Take 1 tablet (40 mg total) by mouth daily. 03/18/21   Ronnell Freshwater, NP  pantoprazole (PROTONIX) 40 MG tablet Take by mouth. 04/21/21   [provider]  promethazine (PHENERGAN) 12.5 MG suppository Place 1 suppository (12.5 mg total) rectally every 6 (six) hours  as needed for refractory nausea / vomiting. 04/08/21   Little Ishikawa, MD  promethazine (PHENERGAN) 25 MG tablet Take 25 mg by mouth 2 (two) times daily as needed. 04/21/21   [provider]    Allergies    Heparin and Penicillins  Review of Systems   Review of Systems  All other systems reviewed and are negative.  Physical Exam Updated Vital Signs BP 96/76 (BP Location: Right Arm)   Pulse 81   Temp 97.9 F (36.6 C) (Oral)   Resp 16   Ht 1.626 m (5\' 4" )   Wt 84 kg   SpO2 100%   BMI 31.79 kg/m   Physical Exam Vitals and nursing note reviewed.  Constitutional:      Appearance: She is well-developed. She is not diaphoretic.     Comments: Elderly, frail  HENT:     Head: Normocephalic and atraumatic.     Right Ear: External ear normal.     Left Ear: External ear normal.  Eyes:     General: No scleral icterus.       Right eye: No discharge.        Left eye: No discharge.     Conjunctiva/sclera: Conjunctivae normal.  Neck:     Trachea: No tracheal deviation.  Cardiovascular:     Rate and Rhythm: Normal rate and regular rhythm.  Pulmonary:     Effort: Pulmonary effort is normal. No respiratory distress.     Breath sounds: Normal breath sounds. No stridor. No wheezing or rales.  Abdominal:     General: Bowel sounds are normal. There is no distension.     Palpations: Abdomen is soft.     Tenderness: There is no abdominal tenderness. There is no guarding or rebound.  Musculoskeletal:        General: No tenderness or deformity.     Cervical back: Neck supple.  Skin:    General: Skin is warm and dry.     Findings: No rash.  Neurological:     Mental Status: She is alert.     GCS: GCS eye subscore is 4. GCS verbal subscore is 4. GCS motor subscore is 6.     Cranial Nerves: No cranial nerve deficit (no facial droop, extraocular movements intact, no slurred speech).     Sensory: No sensory deficit.     Motor: Weakness present. No abnormal muscle tone or seizure  activity.     Coordination: Coordination normal.     Comments: Confused unable to tell me the date she is aware that she is at [facility], no slurred speech, equal grip strength bilaterally, able to move both legs but difficulty lifting either off the bed  Psychiatric:        Mood and Affect: Mood normal.    ED Results / Procedures / Treatments  Labs (all labs ordered are listed, but only abnormal results are displayed) Labs Reviewed  COMPREHENSIVE METABOLIC PANEL - Abnormal; Notable for the following components:      Result Value   Glucose, Bld 117 (*)    BUN 68 (*)    Creatinine, Ser 5.19 (*)    Albumin 3.2 (*)    GFR, Estimated 8 (*)    Anion gap 16 (*)    All other components within normal limits  CBC - Abnormal; Notable for the following components:   RBC 3.45 (*)    Hemoglobin 11.2 (*)    HCT 35.6 (*)    MCV 103.2 (*)    RDW 20.2 (*)    Platelets 148 (*)    All other components within normal limits  LIPASE, BLOOD - Abnormal; Notable for the following components:   Lipase 84 (*)    All other components within normal limits  AMMONIA  PROTIME-INR  URINALYSIS, ROUTINE W REFLEX MICROSCOPIC  CBG MONITORING, ED    EKG None  Radiology DG Thoracic Spine 2 View  Result Date: 05/22/2021 CLINICAL DATA:  Fall. EXAM: THORACIC SPINE 2 VIEWS COMPARISON:  Reformats from chest CT 3 days ago. FINDINGS: Twelve thoracic type vertebra. No evidence of fracture. Mild diffuse degenerative disc disease. Vertebral body heights are normal. Cardiomegaly which is similar to recent CT. No paravertebral soft tissue abnormality. IMPRESSION: Mild diffuse degenerative disc disease without acute fracture. Electronically Signed   By: Keith Rake M.D.   On: 05/22/2021 19:41   DG Lumbar Spine Complete  Result Date: 05/22/2021 CLINICAL DATA:  Post fall. EXAM: LUMBAR SPINE - COMPLETE 4+ VIEW COMPARISON:  Reformats from abdominopelvic CT 3 days ago 05/19/2021 FINDINGS: Bones are subjectively  under mineralized. No acute fracture. Vertebral body heights are normal. Multilevel degenerative disc disease. Multilevel facet hypertrophy. Degenerative change of the sacroiliac joints which remain congruent. Vascular calcifications are seen. IMPRESSION: 1. No acute fracture. 2. Multilevel degenerative disc disease and facet hypertrophy. Electronically Signed   By: Keith Rake M.D.   On: 05/22/2021 20:17   CT HEAD WO CONTRAST (5MM)  Result Date: 05/22/2021 CLINICAL DATA:  Mental status change, unknown cause EXAM: CT HEAD WITHOUT CONTRAST TECHNIQUE: Contiguous axial images were obtained from the base of the skull through the vertex without intravenous contrast. COMPARISON:  Head CT 05/19/2021 FINDINGS: Brain: No evidence of acute intracranial hemorrhage or extra-axial collection.No evidence of mass lesion/concern mass effect.The ventricles are unchanged in size.Confluent periventricular and subcortical white matter hypoattenuation, which is nonspecific but likely sequela of chronic small vessel ischemic disease.Mild cerebral atrophy. Unchanged encephalomalacia in the right MCA territory from prior infarct. Vascular: No hyperdense vessel or unexpected calcification. Skull: Normal. Negative for fracture or focal lesion. Sinuses/Orbits: The paranasal sinuses are predominantly clear. Other: None. IMPRESSION: No acute intracranial abnormality. Unchanged sequela of chronic small vessel ischemic disease and cerebral atrophy. 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 Electronically Signed   By: Maurine Simmering M.D.   On: 05/22/2021 19:58    Procedures Procedures   Medications Ordered in ED Medications - No data to display  ED Course  I have reviewed the triage vital signs and the nursing notes.  Pertinent labs & imaging results that were available during my care of the patient were reviewed by me and considered in my medical decision making (see chart for details).  Clinical Course as of 05/22/21 2057   Fri May 22, 2021  1819 Records reviewed from her August 30 visit.  Imaging test reviewed.  Patient did have a CT scan of her head and C-spine.  She also had a CT scan of her abdomen pelvis. [JK]  7543 Metabolic panel consistent with her chronic kidney disease [JK]  1914 CBC unremarkable [JK]  2049 Ammonia level normal.  Lipase elevated 84 [JK]  2050 Lumbar and thoracic spine films without acute findings.  Head CT without acute findings. [JK]    Clinical Course User Index [JK] Dorie Rank, MD   MDM Rules/Calculators/A&P                           Patient presenting with recurrent weakness, confusion.  Patient has history of chronic kidney disease.  Also has history of metastatic CA.  Family sent patient in for recurrent episodes of weakness near syncope confusion.  In the ED patient is confused about the date and time although she is answering questions otherwise appropriately.  Afebrile no signs of infection.  No signs of acute blood loss anemia.  Chronic kidney disease noted but no acute electrolyte abnormalities.  No evidence of hepatic encephalopathy.  Head CT does not show any signs of hemorrhage.  Patient continues to be weak and have difficulty ambulating.  Symptoms may be multifactorial.  Think she warrants admission for observation and cardiac monitoring.  Would also consider MRI to rule out occult stroke.  I will consult the medical service for further evaluation Final Clinical Impression(s) / ED Diagnoses Final diagnoses:  Weakness  Confusion  Metastatic malignant neoplasm, unspecified site [facility])     Dorie Rank, MD 05/22/21 2057

## 2021-05-22 NOTE — ED Notes (Signed)
Back to hallway at this time.

## 2021-05-22 NOTE — H&P (Addendum)
History and Physical    Sonya Beck ZOX:[id number] DOB: 1950/11/21 DOA: 05/22/2021  PCP: Ronnell Freshwater, NP  Patient coming from: home  I have personally briefly reviewed patient's old medical records in [place]  Chief Complaint: weakness Historian: patient and her son, [name] HPI:  Sonya Beck is a 70 y.o. 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 They presented from home via EMS to the ED on 05/22/2021. Patient was unable to provide a reliable history so spoke with son [name] over the phone. He states that she has had a generalized state of confusion since 8/15 after her appointment with her oncologist. Her personality has drastically changed. She often "throws tantrums" and onto the floor. She has not had any additional medications in that time period. He was advised by [facility] to call EMS when she throws herself to the floor and he's not able to get her back up again so that is why he called EMS today. He would like for her to have a psych evaluation. He states that at baseline, she is able to ambulate with a rolling walker but only when she wants to. Currently, patient thinks that she is in an ambulance and that she called EMS for "several big issues". She does not complain of any pain or current concerns. She states that she does still make urine. Denies any medications other than eliquis and her son corroborates that information. Patient was admitted to medicine service for further workup and management of AMS as outlined in detail below.  ED Course: unremarkable labs/imaging for acute cause of encephalopathy. No interventions were initiated. ED physician ordered spine xrays due to patient complaining of back pain which were unremarkable.   Review of Systems: As per HPI otherwise 10 point  review of systems negative.    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 Her smoking use included cigarettes. She has never used smokeless tobacco. She reports that she does not currently use alcohol. She reports that she does not use drugs.  Allergies  Allergen Reactions   Heparin Other (See Comments)    HITT   Penicillins Swelling    Swelling in mouth    Family History  Problem Relation Age of Onset   Heart disease Neg Hx    Prior to Admission medications   Medication Sig Start Date End Date Taking? Authorizing Provider  apixaban (ELIQUIS) 5 MG TABS tablet Take 1 tablet (5 mg total) by mouth 2 (two) times daily. 03/18/21   Ronnell Freshwater, NP  cabozantinib (CABOMETYX) 40 MG tablet Take 1 tablet (40 mg total) by mouth daily. Take on an empty stomach, 1 hour before or 2 hours after meals. 04/13/21   Wyatt Portela, MD  cabozantinib (CABOMETYX) 40 MG tablet Take by mouth. 04/21/21  [provider]  iron sucrose in sodium chloride 0.9 % 100 mL Iron Sucrose (Venofer) 04/16/21 04/08/22  [provider]  ondansetron (ZOFRAN) 4 MG tablet Take 1 tablet (4 mg total) by mouth every 6 (six) hours as needed for nausea. 04/08/21   Little Ishikawa, MD  ondansetron (ZOFRAN) 4 MG tablet Take by mouth. 04/21/21   [provider]  pantoprazole (PROTONIX) 40 MG tablet Take 1 tablet (40 mg total) by mouth daily. 03/18/21   Ronnell Freshwater, NP  pantoprazole (PROTONIX) 40 MG tablet Take by mouth.  04/21/21   [provider]  promethazine (PHENERGAN) 12.5 MG suppository Place 1 suppository (12.5 mg total) rectally every 6 (six) hours as needed for refractory nausea / vomiting. 04/08/21   Little Ishikawa, MD  promethazine (PHENERGAN) 25 MG tablet Take 25 mg by mouth 2 (two) times daily as needed. 04/21/21   [provider]    Physical Exam: Vitals:   05/22/21 1543 05/22/21 1546 05/22/21 2140  BP: 96/76  110/70  Pulse: 81  82  Resp: 16  16  Temp: 97.9 F (36.6 C)  98 F (36.7 C)  TempSrc: Oral  Oral  SpO2: 100%  99%  Weight:  84 kg   Height:  5\' 4"  (1.626 m)     Constitutional: NAD, calm, comfortable Eyes: PERRL, lids and conjunctivae normal ENMT: Mucous membranes are moist. Posterior pharynx clear of any exudate or lesions.Normal dentition.  Neck: normal, supple, no masses, no thyromegaly Respiratory: CTAB, no wheezing, no crackles. Normal respiratory effort. No accessory muscle use.  Cardiovascular: RRR, no murmurs / rubs / gallops. 2+ LE edema. 2+ pedal pulses. No carotid bruits.  Abdomen: soft, NT, ND, no masses or HSM palpated. +Bowel sounds.  Musculoskeletal: no clubbing / cyanosis. No joint deformity upper and lower extremities. Normal muscle tone.  Skin: dry, intact, excoriations on UEs, normal temperature Neurologic: alert and oriented to self. Able to follow commands and move all extremities with bilateral LE weakness 4/5. Normal speech.  Grossly non-focal exam. Psychiatric: Normal mood. Congruent affect. Lacks insight.  Labs on Admission: I have personally reviewed following labs and imaging studies  CBC: Recent Labs  Lab 05/19/21 0820 05/22/21 1552  WBC 4.7 4.1  NEUTROABS 3.2  --   HGB 12.0 11.2*  HCT 38.1 35.6*  MCV 102.7* 103.2*  PLT 108* 235*   Basic Metabolic Panel: Recent Labs  Lab 05/19/21 0820 05/22/21 1552  NA 141 144  K 4.0 4.0  CL 98 98  CO2 26 30  GLUCOSE 122* 117*  BUN 65* 68*  CREATININE 5.03* 5.19*  CALCIUM  10.4* 10.3   GFR: Estimated Creatinine Clearance: 10.3 mL/min (A) (by C-G formula based on SCr of 5.19 mg/dL (H)). Liver Function Tests: Recent Labs  Lab 05/19/21 0820 05/22/21 1552  AST 26 25  ALT 23 21  ALKPHOS 79 80  BILITOT 1.6* 1.1  PROT 7.2 6.8  ALBUMIN 3.4* 3.2*   Recent Labs  Lab 05/22/21 1947  LIPASE 84*   Recent Labs  Lab 05/22/21 1947  AMMONIA 12   Coagulation Profile: Recent Labs  Lab 05/19/21 0820 05/22/21 1947  INR 1.2 1.2   Radiological Exams on Admission: DG Thoracic Spine 2 View  Result Date: 05/22/2021 CLINICAL DATA:  Fall. EXAM: THORACIC SPINE 2 VIEWS COMPARISON:  Reformats from chest CT 3 days ago. FINDINGS: Twelve thoracic type vertebra. No evidence of fracture. Mild diffuse degenerative disc disease. Vertebral body heights are normal. Cardiomegaly which is similar  to recent CT. No paravertebral soft tissue abnormality. IMPRESSION: Mild diffuse degenerative disc disease without acute fracture. Electronically Signed   By: Keith Rake M.D.   On: 05/22/2021 19:41   DG Lumbar Spine Complete  Result Date: 05/22/2021 CLINICAL DATA:  Post fall. EXAM: LUMBAR SPINE - COMPLETE 4+ VIEW COMPARISON:  Reformats from abdominopelvic CT 3 days ago 05/19/2021 FINDINGS: Bones are subjectively under mineralized. No acute fracture. Vertebral body heights are normal. Multilevel degenerative disc disease. Multilevel facet hypertrophy. Degenerative change of the sacroiliac joints which remain congruent. Vascular calcifications are seen. IMPRESSION: 1. No acute fracture. 2. Multilevel degenerative disc disease and facet hypertrophy. Electronically Signed   By: Keith Rake M.D.   On: 05/22/2021 20:17   CT HEAD WO CONTRAST (5MM)  Result Date: 05/22/2021 CLINICAL DATA:  Mental status change, unknown cause EXAM: CT HEAD WITHOUT CONTRAST TECHNIQUE: Contiguous axial images were obtained from the base of the skull through the vertex without intravenous contrast. COMPARISON:   Head CT 05/19/2021 FINDINGS: Brain: No evidence of acute intracranial hemorrhage or extra-axial collection.No evidence of mass lesion/concern mass effect.The ventricles are unchanged in size.Confluent periventricular and subcortical white matter hypoattenuation, which is nonspecific but likely sequela of chronic small vessel ischemic disease.Mild cerebral atrophy. Unchanged encephalomalacia in the right MCA territory from prior infarct. Vascular: No hyperdense vessel or unexpected calcification. Skull: Normal. Negative for fracture or focal lesion. Sinuses/Orbits: The paranasal sinuses are predominantly clear. Other: None. IMPRESSION: No acute intracranial abnormality. Unchanged sequela of chronic small vessel ischemic disease and cerebral atrophy. 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 Electronically Signed   By: Maurine Simmering M.D.   On: 05/22/2021 19:58    EKG: Independently reviewed. NSR  Assessment/Plan Active Problems:   AMS (altered mental status)  AMS- likely worsening of chronic dementia without acute findings suggestive of metabolic encephalopathy or acute CT findings. Consider brain metastasis. Patient's son endorses difficulty with safe home care given level of need of patient and is open to discussion with palliative care and TOC on support options. They have OP appointment with palliative for 9/9 and receive [facility] currently but patient's behavioral concerns make following up with care, including dialysis, difficult for the son. No current medications contributing as she is only taking eliquis. - observation overnight and continue rule out of reversible causes of AMS - notify oncologist of admission and change in behavior - consider brain MRI to evaluate for metastasis - PT/OT evaluation - TOC/palliative consults - delirium precautions  H/o VTE- not complaining of SOB, CP, cough currently. 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 Not currently on treatment - notify oncologist of admission  DVT prophylaxis: eliquis   Code Status: full  Family Communication: spoke with son, [name] on the phone  Disposition Plan: observation  Consults called: paged nephrology for routine dialysis   Admission status:  Status is: Observation  The patient remains OBS appropriate and will d/c before 2 midnights.  Dispo: The patient is from: Home              Anticipated d/c is to: SNF              Patient currently is not medically stable to d/c.  Difficult to place patient No   Richarda Osmond, DO [facility]  05/22/2021, 9:42 PM    If 7PM-7AM, please contact night-coverage. How to contact the [facility] Attending or Consulting provider [place] or covering provider during after hours [place], for this patient?    Check the care team in [facility] and look for a) attending/consulting [facility] provider listed and b) the [facility] team listed Log into [email or web address] and use [place]'s universal password to access. If you do not have the password, please contact the hospital operator. Locate the [facility] provider you are looking for under [facility] and page to a number that you can be directly reached. If you still have difficulty reaching the provider, please page the [facility] (Director on Call) for the Hospitalists listed on amion for assistance.

## 2021-05-22 NOTE — ED Triage Notes (Signed)
The pt arrived by [facility] from home where the pt lives with her son.. the family told the ems that the pt has been confused for 2 weeks  .  A and o x4   the pt is dialysis  dialyzed yesterday.    Fistula lt arm

## 2021-05-22 NOTE — ED Notes (Signed)
Patient transported to CT 

## 2021-05-22 NOTE — ED Notes (Signed)
Back to hallway 

## 2021-05-22 NOTE — ED Triage Notes (Signed)
The pt reports that she urinates like normal even though she is a diabetic

## 2021-05-22 NOTE — ED Notes (Signed)
Patient transported to X-ray 

## 2021-05-23 ENCOUNTER — Inpatient Hospital Stay (HOSPITAL_COMMUNITY): Payer: Medicare Other

## 2021-05-23 DIAGNOSIS — C799 Secondary malignant neoplasm of unspecified site: Secondary | ICD-10-CM | POA: Diagnosis not present

## 2021-05-23 DIAGNOSIS — L89301 Pressure ulcer of unspecified buttock, stage 1: Secondary | ICD-10-CM | POA: Diagnosis present

## 2021-05-23 DIAGNOSIS — G9341 Metabolic encephalopathy: Secondary | ICD-10-CM | POA: Diagnosis present

## 2021-05-23 DIAGNOSIS — E785 Hyperlipidemia, unspecified: Secondary | ICD-10-CM | POA: Diagnosis present

## 2021-05-23 DIAGNOSIS — C649 Malignant neoplasm of unspecified kidney, except renal pelvis: Secondary | ICD-10-CM | POA: Diagnosis present

## 2021-05-23 DIAGNOSIS — Z992 Dependence on renal dialysis: Secondary | ICD-10-CM | POA: Diagnosis not present

## 2021-05-23 DIAGNOSIS — C787 Secondary malignant neoplasm of liver and intrahepatic bile duct: Secondary | ICD-10-CM

## 2021-05-23 DIAGNOSIS — D649 Anemia, unspecified: Secondary | ICD-10-CM | POA: Diagnosis present

## 2021-05-23 DIAGNOSIS — Z20822 Contact with and (suspected) exposure to covid-19: Secondary | ICD-10-CM | POA: Diagnosis present

## 2021-05-23 DIAGNOSIS — R410 Disorientation, unspecified: Secondary | ICD-10-CM | POA: Diagnosis not present

## 2021-05-23 DIAGNOSIS — L8990 Pressure ulcer of unspecified site, unspecified stage: Secondary | ICD-10-CM | POA: Diagnosis present

## 2021-05-23 DIAGNOSIS — I350 Nonrheumatic aortic (valve) stenosis: Secondary | ICD-10-CM | POA: Diagnosis present

## 2021-05-23 DIAGNOSIS — R531 Weakness: Secondary | ICD-10-CM | POA: Diagnosis not present

## 2021-05-23 DIAGNOSIS — R0602 Shortness of breath: Secondary | ICD-10-CM | POA: Diagnosis not present

## 2021-05-23 DIAGNOSIS — N3000 Acute cystitis without hematuria: Secondary | ICD-10-CM

## 2021-05-23 DIAGNOSIS — D696 Thrombocytopenia, unspecified: Secondary | ICD-10-CM | POA: Diagnosis present

## 2021-05-23 DIAGNOSIS — K219 Gastro-esophageal reflux disease without esophagitis: Secondary | ICD-10-CM | POA: Diagnosis present

## 2021-05-23 DIAGNOSIS — N2581 Secondary hyperparathyroidism of renal origin: Secondary | ICD-10-CM | POA: Diagnosis present

## 2021-05-23 DIAGNOSIS — I6330 Cerebral infarction due to thrombosis of unspecified cerebral artery: Secondary | ICD-10-CM | POA: Diagnosis not present

## 2021-05-23 DIAGNOSIS — D631 Anemia in chronic kidney disease: Secondary | ICD-10-CM | POA: Diagnosis present

## 2021-05-23 DIAGNOSIS — N390 Urinary tract infection, site not specified: Secondary | ICD-10-CM | POA: Diagnosis present

## 2021-05-23 DIAGNOSIS — Z86718 Personal history of other venous thrombosis and embolism: Secondary | ICD-10-CM | POA: Diagnosis not present

## 2021-05-23 DIAGNOSIS — Z66 Do not resuscitate: Secondary | ICD-10-CM | POA: Diagnosis not present

## 2021-05-23 DIAGNOSIS — N186 End stage renal disease: Secondary | ICD-10-CM

## 2021-05-23 DIAGNOSIS — Z9221 Personal history of antineoplastic chemotherapy: Secondary | ICD-10-CM | POA: Diagnosis not present

## 2021-05-23 DIAGNOSIS — Z515 Encounter for palliative care: Secondary | ICD-10-CM | POA: Diagnosis not present

## 2021-05-23 DIAGNOSIS — F0390 Unspecified dementia without behavioral disturbance: Secondary | ICD-10-CM | POA: Diagnosis present

## 2021-05-23 DIAGNOSIS — I132 Hypertensive heart and chronic kidney disease with heart failure and with stage 5 chronic kidney disease, or end stage renal disease: Secondary | ICD-10-CM | POA: Diagnosis present

## 2021-05-23 DIAGNOSIS — R4182 Altered mental status, unspecified: Secondary | ICD-10-CM | POA: Diagnosis present

## 2021-05-23 DIAGNOSIS — I509 Heart failure, unspecified: Secondary | ICD-10-CM | POA: Diagnosis present

## 2021-05-23 DIAGNOSIS — Z7189 Other specified counseling: Secondary | ICD-10-CM | POA: Diagnosis not present

## 2021-05-23 DIAGNOSIS — I639 Cerebral infarction, unspecified: Secondary | ICD-10-CM | POA: Diagnosis not present

## 2021-05-23 DIAGNOSIS — F419 Anxiety disorder, unspecified: Secondary | ICD-10-CM | POA: Diagnosis present

## 2021-05-23 LAB — SARS CORONAVIRUS 2 (TAT 6-24 HRS): SARS Coronavirus 2: NEGATIVE

## 2021-05-23 LAB — RENAL FUNCTION PANEL
Albumin: 3.1 g/dL — ABNORMAL LOW (ref 3.5–5.0)
Anion gap: 15 (ref 5–15)
BUN: 74 mg/dL — ABNORMAL HIGH (ref 8–23)
CO2: 28 mmol/L (ref 22–32)
Calcium: 10.4 mg/dL — ABNORMAL HIGH (ref 8.9–10.3)
Chloride: 99 mmol/L (ref 98–111)
Creatinine, Ser: 5.29 mg/dL — ABNORMAL HIGH (ref 0.44–1.00)
GFR, Estimated: 8 mL/min — ABNORMAL LOW (ref >60)
Glucose, Bld: 155 mg/dL — ABNORMAL HIGH (ref 70–99)
Phosphorus: 5 mg/dL — ABNORMAL HIGH (ref 2.5–4.6)
Potassium: 3.9 mmol/L (ref 3.5–5.1)
Sodium: 142 mmol/L (ref 135–145)

## 2021-05-23 MED ORDER — PROSOURCE PLUS PO LIQD
30.0000 mL | Freq: Two times a day (BID) | ORAL | Status: DC
  Administered 2021-05-23: 30 mL via ORAL
  Filled 2021-05-23: qty 30

## 2021-05-23 MED ORDER — CINACALCET HCL 30 MG PO TABS: 30.0000 mg | ORAL_TABLET | ORAL | Status: DC

## 2021-05-23 MED ORDER — APIXABAN 2.5 MG PO TABS
2.5000 mg | ORAL_TABLET | Freq: Two times a day (BID) | ORAL | Status: DC
Start: 2021-05-23 — End: 2021-05-24

## 2021-05-23 MED ORDER — SODIUM CHLORIDE 0.9 % IV SOLN: 100.0000 mL | INTRAVENOUS | Status: DC | PRN

## 2021-05-23 MED ORDER — SODIUM CHLORIDE 0.9 % IV SOLN
1.0000 g | INTRAVENOUS | Status: DC
  Administered 2021-05-23 – 2021-05-24 (×2): 1 g via INTRAVENOUS
  Filled 2021-05-23 (×2): qty 10

## 2021-05-23 MED ORDER — HYDROCODONE-ACETAMINOPHEN 5-325 MG PO TABS
1.0000 | ORAL_TABLET | Freq: Four times a day (QID) | ORAL | Status: DC | PRN
Start: 2021-05-23 — End: 2021-05-25

## 2021-05-23 MED ORDER — PANTOPRAZOLE SODIUM 40 MG PO TBEC
40.0000 mg | DELAYED_RELEASE_TABLET | Freq: Every day | ORAL | Status: DC
  Administered 2021-05-23: 40 mg via ORAL
  Filled 2021-05-23: qty 1

## 2021-05-23 MED ORDER — STROKE: EARLY STAGES OF RECOVERY BOOK
Freq: Once | Status: AC
  Filled 2021-05-23: qty 1

## 2021-05-23 MED ORDER — CHLORHEXIDINE GLUCONATE CLOTH 2 % EX PADS
6.0000 | MEDICATED_PAD | Freq: Every day | CUTANEOUS | Status: DC
  Administered 2021-05-23 – 2021-05-24 (×2): 6 via TOPICAL

## 2021-05-23 MED ORDER — LIDOCAINE HCL (PF) 1 % IJ SOLN: 5.0000 mL | INTRAMUSCULAR | Status: DC | PRN

## 2021-05-23 MED ORDER — GADOBUTROL 1 MMOL/ML IV SOLN
8.4000 mL | Freq: Once | INTRAVENOUS | Status: AC | PRN
  Administered 2021-05-23: 8.4 mL via INTRAVENOUS

## 2021-05-23 MED ORDER — PENTAFLUOROPROP-TETRAFLUOROETH EX AERO: 1.0000 "application " | INHALATION_SPRAY | CUTANEOUS | Status: DC | PRN

## 2021-05-23 MED ORDER — LIDOCAINE-PRILOCAINE 2.5-2.5 % EX CREA
1.0000 "application " | TOPICAL_CREAM | CUTANEOUS | Status: DC | PRN
  Filled 2021-05-23: qty 5

## 2021-05-23 NOTE — Evaluation (Signed)
Occupational Therapy Evaluation Patient Details Name: Sonya Beck MRN: [id number] DOB: December 17, 1950 Today's Date: 05/23/2021    History of Present Illness Pt is a 70 y/o female presenting to ED on 6/29 with complaints of shortness of breath and edema. CXR shows cardiomegaly, vascular congestion. 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   Clinical Impression   Pt presented with nursing sitting EOB and at the start of session reported they were not going to be able to stand or walk but as session progressed agreed to complete transfer and sit up for breakfast. Pt completed sit to stand transfer with min-mod x2 to start transfer and as pt reported their BLE are weak. Pt then ambulated to chair with RW and two person assist to guide pt with min guard to min assist. Pt once in chair reported that was a lot better then they expected.  Pt currently with functional limitations due to the deficits listed below (see OT Problem List).  Pt will benefit from skilled OT to increase their safety and independence with ADL and functional mobility for ADL to facilitate discharge to venue listed below.      Follow Up Recommendations  SNF;Supervision/Assistance - 24 hour;Other (comment) (pt may not want to go to SNF level and may need more communication to son. If they do not agree then would recommend [facility])    Equipment Recommendations  Tub/shower bench    Recommendations for Other Services Other (comment) (Psych consult)     Precautions / Restrictions Precautions Precautions: Fall;Other (comment) (pt noted to have bruising on LLE) Precaution Comments: Pt reported on how BLE have been heavy Restrictions Weight Bearing Restrictions: No      Mobility Bed Mobility Overal bed mobility:  (Pt presented sitting at EOB)                   Transfers Overall transfer level: Needs assistance Equipment used: Standard walker Transfers: Sit to/from Stand Sit to Stand: Mod assist;+2 physical assistance;+2 safety/equipment;From elevated surface (Pt required assist to complete transfer but may have complete with less assistance but appeared to be fearful in session with any transfers)              Balance Overall balance assessment: Needs assistance Sitting-balance support: Feet supported Sitting balance-Leahy Scale: Fair     Standing balance support: Bilateral upper extremity supported Standing balance-Leahy Scale: Poor Standing balance comment: Pt fearful of BLE weakness                           ADL either performed or assessed with clinical judgement   ADL Overall ADL's : Needs assistance/impaired Eating/Feeding: Independent;Sitting   Grooming: Wash/dry hands;Wash/dry face;Set up;Sitting;Cueing for safety;Cueing for sequencing   Upper Body Bathing: Set up;Cueing for safety;Cueing for sequencing;Sitting   Lower Body Bathing: Moderate assistance;Cueing for safety;Cueing for sequencing;Sitting/lateral leans   Upper Body Dressing : Set up;Cueing for safety;Cueing for sequencing;Sitting   Lower Body Dressing: Moderate assistance;Cueing for safety;Cueing for sequencing;Sitting/lateral leans   Toilet Transfer: Minimal assistance;+2 for physical assistance;+2 for safety/equipment;Cueing for safety;Cueing for sequencing;Ambulation   Toileting- Clothing Manipulation and Hygiene: Maximal assistance;Cueing for safety;Cueing for sequencing;Sit to/from stand       Functional mobility during ADLs: Minimal assistance;+2 for physical assistance;+2 for safety/equipment;Cueing for sequencing;Cueing for safety;Rolling walker (only would agree to small distance ambulation and x2 for  pt to feel increase in saftey with ADLS)       Vision Baseline Vision/History: 1 Wears glasses Ability to See in Adequate Light: 0  Adequate Patient Visual Report: No change from baseline       Perception     Praxis      Pertinent Vitals/Pain Pain Assessment: Faces Faces Pain Scale: Hurts a little bit Pain Location: touch to BLE as edema noted Pain Descriptors / Indicators: Aching Pain Intervention(s): Limited activity within patient's tolerance     Hand Dominance Right   Extremity/Trunk Assessment Upper Extremity Assessment Upper Extremity Assessment: Overall WFL for tasks assessed   Lower Extremity Assessment Lower Extremity Assessment: Defer to PT evaluation (noted edema in BLE)   Cervical / Trunk Assessment Cervical / Trunk Assessment: Kyphotic   Communication Communication Communication: No difficulties   Cognition Arousal/Alertness: Awake/alert Behavior During Therapy:  (pt at the start of session was slightly agitated as reproting "that is all you are getting from me" and you are going to throw me off the cliff" but then engaged in more activity and reported "thank you" at the end of session) Overall Cognitive Status: No family/caregiver present to determine baseline cognitive functioning                                 General Comments: noted in rereports there wew some behavior reports   General Comments       Exercises     Shoulder Instructions      Home Living Family/patient expects to be discharged to:: Private residence Living Arrangements: Children Available Help at Discharge: Family;Available PRN/intermittently Type of Home: House Home Access: Ramped entrance     Home Layout: Multi-level;Able to live on main level with bedroom/bathroom     Bathroom Shower/Tub: Tub/shower unit   Bathroom Toilet: Standard Bathroom Accessibility: Yes   Home Equipment: Walker - 4 wheels;Shower seat;Cane - single point;Hand held shower head;Electric scooter   Additional Comments: pt reports she has "access to [facility]" in bathroom.      Prior Functioning/Environment Level of  Independence: Needs assistance  Gait / Transfers Assistance Needed: walking with rollator, pt states she is mostly in bed at home. has reccently required assist from her son and his friend to stand ADL's / Homemaking Assistance Needed: assist from son for IADLs and "some" assist with ADLs   Comments: Pt reports ambulating in her home and outdoors primarily using SPC.        OT Problem List: Decreased strength;Decreased activity tolerance;Impaired balance (sitting and/or standing);Decreased safety awareness;Decreased knowledge of use of DME or AE;Pain      OT Treatment/Interventions: Self-care/ADL training;Therapeutic exercise;DME and/or AE instruction;Therapeutic activities;Patient/family education;Balance training    OT Goals(Current goals can be found in the care plan section) Acute Rehab OT Goals Patient Stated Goal: to have breakfast OT Goal Formulation: With patient Time For Goal Achievement: 06/06/21 Potential to Achieve Goals: Good ADL Goals Pt Will Perform Grooming: with set-up;standing Pt Will Perform Upper Body Bathing: with set-up;sitting Pt Will Perform Lower Body Bathing: with supervision;sit to/from stand Pt Will Transfer to Toilet: with modified independence;ambulating;bedside commode  OT Frequency: Min 2X/week   Barriers to D/C:            Co-evaluation PT/OT/SLP Co-Evaluation/Treatment: Yes Reason for Co-Treatment: Complexity of the patient's impairments (multi-system involvement);Other (comment) (participation in session)   OT goals addressed during session: ADL's and self-care      AM-PAC OT "  6 Clicks" Daily Activity     Outcome Measure Help from another person eating meals?: None Help from another person taking care of personal grooming?: A Little Help from another person toileting, which includes using toliet, bedpan, or urinal?: A Lot Help from another person bathing (including washing, rinsing, drying)?: A Lot Help from another person to put on and  taking off regular upper body clothing?: A Little Help from another person to put on and taking off regular lower body clothing?: A Lot 6 Click Score: 16   End of Session Equipment Utilized During Treatment: Gait belt;Rolling walker Nurse Communication: Mobility status  Activity Tolerance: Patient tolerated treatment well Patient left: in chair;with call bell/phone within reach;with chair alarm set  OT Visit Diagnosis: Unsteadiness on feet (R26.81);Repeated falls (R29.6);Other abnormalities of gait and mobility (R26.89);Muscle weakness (generalized) (M62.81);History of falling (Z91.81);Pain Pain - part of body: Leg                Time: 6825-7493 OT Time Calculation (min): 24 min Charges:  OT General Charges $OT Visit: 1 Visit OT Evaluation $OT Eval Low Complexity: [place] OTR/L  Acute Rehab Services  [phone] office number [phone] pager number   Joeseph Amor 05/23/2021, 9:42 AM

## 2021-05-23 NOTE — Consult Note (Signed)
[facility] Renal Consultation Note    Indication for Consultation:  Management of ESRD/hemodialysis, anemia, hypertension/volume, and secondary hyperparathyroidism. PCP:  HPI: Sonya Beck is a 70 y.o. female with ESRD, Hx CVA, Hx endometrial cancer (s/p hysterectomy), and Hx RCC s/p partial nephrectomy then recurred with mets to liver who is being admitted with AMS.  Son brought her to the ED yesterday with concern of 2 weeks of severe confusion. She has been weak and been in the ED twice s/p falls. In talking with her, she can hold a superficial conversation easily. Tells me breathing is a little rough, worse with laying, and would like to sit up. Denies CP or abdominal pain. Oriented to person only when you dig deeper. Palliative care was consulted and did an excellent, thorough history of the current mental status changes -- see their consult from 9/3. Has been having issues with ADLs at home - will not  follow directions consistently, throwing "tantrums."  Dialyzes at [facility] clinic on TTS schedyle - she is due for dialysis today.  Past Medical History:  Diagnosis Date   Cancer [facility])    Endometrial adenocarcinoma ([place]) 04/17/2018   Fibromyalgia 03/18/2021   Gastroesophageal reflux disease without esophagitis 03/18/2021   History of blood transfusion    History of DVT of lower extremity 03/18/2021   History of stroke 03/18/2021   Metastatic renal cell carcinoma to liver ([place]) 03/18/2021   Severe aortic stenosis 03/18/2021   Stroke [facility])    Past Surgical History:  Procedure Laterality Date   RENAL BIOPSY     Family History  Problem Relation Age of Onset   Heart disease Neg Hx    Social History:  reports that she has quit smoking. Her smoking use included cigarettes. She has never used smokeless tobacco. She reports that she does not currently use alcohol. She reports that she does not use drugs.  ROS: As per HPI otherwise negative.  Physical Exam: Vitals:    05/23/21 0030 05/23/21 0141 05/23/21 0256 05/23/21 0823  BP: (!) 100/53 100/79 106/65   Pulse: 81 82 82 80  Resp: 16 (!) 23 18 20   Temp:   98.3 F (36.8 C) 97.9 F (36.6 C)  TempSrc:   Oral Oral  SpO2: 95% 97% 96%   Weight:      Height:         General: Well developed, well nourished. Mild ^ WOB and nasal O2 applied to her. Head: Normocephalic, atraumatic, sclera non-icteric, mucus membranes are moist. Neck: Supple without lymphadenopathy/masses. JVD not elevated. Lungs: Clear bilaterally to auscultation without wheezes, rales, or rhonchi. Heart: RRR with normal S1, S2. No murmurs, rubs, or gallops appreciated. Abdomen: Soft, non-tender, non-distended with normoactive bowel sounds.  Lower extremities: 1-2+ BLE edema. No ischemic changes, no open wounds. Neuro: Alert, oriented to person. Moves all extremities spontaneously. Dialysis Access: LUE AVF + bruising  Allergies  Allergen Reactions   Heparin Other (See Comments)    HITT   Penicillins Swelling    Swelling in mouth    Prior to Admission medications   Medication Sig Start Date End Date Taking? Authorizing Provider  apixaban (ELIQUIS) 5 MG TABS tablet Take 1 tablet (5 mg total) by mouth 2 (two) times daily. Patient taking differently: Take 5 mg by mouth See admin instructions. Given at dialysis on Tuesday,Thursday,saturday 03/18/21  Yes Ronnell Freshwater, NP  iron sucrose in sodium chloride 0.9 % 100 mL Iron Sucrose (Venofer) 04/16/21 04/08/22 Yes [provider]  Nutritional Supplements (  ENSURE ORIGINAL PO) Take 237 mLs by mouth 4 (four) times daily.   Yes [provider]  ondansetron (ZOFRAN) 4 MG tablet Take 1 tablet (4 mg total) by mouth every 6 (six) hours as needed for nausea. 04/08/21  Yes Little Ishikawa, MD  pantoprazole (PROTONIX) 40 MG tablet Take 1 tablet (40 mg total) by mouth daily. Patient taking differently: Take 40 mg by mouth daily as needed. 03/18/21  Yes Boscia, Greer Ee, NP   cabozantinib (CABOMETYX) 40 MG tablet Take 1 tablet (40 mg total) by mouth daily. Take on an empty stomach, 1 hour before or 2 hours after meals. Patient not taking: Reported on 05/22/2021 04/13/21   Wyatt Portela, MD  promethazine (PHENERGAN) 12.5 MG suppository Place 1 suppository (12.5 mg total) rectally every 6 (six) hours as needed for refractory nausea / vomiting. Patient not taking: No sig reported 04/08/21   Little Ishikawa, MD   Current Facility-Administered Medications  Medication Dose Route Frequency Provider Last Rate Last Admin   apixaban (ELIQUIS) tablet 2.5 mg  2.5 mg Oral BID [place] Eric J, DO       cefTRIAXone (ROCEPHIN) 1 g in sodium chloride 0.9 % 100 mL IVPB  1 g Intravenous Q24H [place] Donnamarie Poag, DO 200 mL/hr at 05/23/21 0929 1 g at 05/23/21 7322   Chlorhexidine Gluconate Cloth 2 % PADS 6 each  6 each Topical Q0600 Loren Racer, PA-C       HYDROcodone-acetaminophen (NORCO/VICODIN) 5-325 MG per tablet 1 tablet  1 tablet Oral Q6H PRN [place] Eric J, DO       ondansetron [facility]) tablet 4 mg  4 mg Oral Q6H PRN Richarda Osmond, MD       pantoprazole (PROTONIX) EC tablet 40 mg  40 mg Oral Daily [place] Eric J, [place]       Labs: Basic Metabolic Panel: Recent Labs  Lab 05/19/21 0820 05/22/21 1552 05/23/21 0111  NA 141 144 142  K 4.0 4.0 3.9  CL 98 98 99  CO2 26 30 28   GLUCOSE 122* 117* 155*  BUN 65* 68* 74*  CREATININE 5.03* 5.19* 5.29*  CALCIUM 10.4* 10.3 10.4*  PHOS  --   --  5.0*   Liver Function Tests: Recent Labs  Lab 05/19/21 0820 05/22/21 1552 05/23/21 0111  AST 26 25  --   ALT 23 21  --   ALKPHOS 79 80  --   BILITOT 1.6* 1.1  --   PROT 7.2 6.8  --   ALBUMIN 3.4* 3.2* 3.1*   Recent Labs  Lab 05/22/21 1947  LIPASE 84*   Recent Labs  Lab 05/22/21 1947  AMMONIA 12   CBC: Recent Labs  Lab 05/19/21 0820 05/22/21 1552  WBC 4.7 4.1  NEUTROABS 3.2  --   HGB 12.0 11.2*  HCT 38.1 35.6*  MCV 102.7* 103.2*  PLT 108* 148*    Studies/Results: DG Thoracic Spine 2 View  Result Date: 05/22/2021 CLINICAL DATA:  Fall. EXAM: THORACIC SPINE 2 VIEWS COMPARISON:  Reformats from chest CT 3 days ago. FINDINGS: Twelve thoracic type vertebra. No evidence of fracture. Mild diffuse degenerative disc disease. Vertebral body heights are normal. Cardiomegaly which is similar to recent CT. No paravertebral soft tissue abnormality. IMPRESSION: Mild diffuse degenerative disc disease without acute fracture. Electronically Signed   By: Keith Rake M.D.   On: 05/22/2021 19:41   DG Lumbar Spine Complete  Result Date: 05/22/2021 CLINICAL DATA:  Post fall. EXAM: LUMBAR SPINE - COMPLETE  4+ VIEW COMPARISON:  Reformats from abdominopelvic CT 3 days ago 05/19/2021 FINDINGS: Bones are subjectively under mineralized. No acute fracture. Vertebral body heights are normal. Multilevel degenerative disc disease. Multilevel facet hypertrophy. Degenerative change of the sacroiliac joints which remain congruent. Vascular calcifications are seen. IMPRESSION: 1. No acute fracture. 2. Multilevel degenerative disc disease and facet hypertrophy. Electronically Signed   By: Keith Rake M.D.   On: 05/22/2021 20:17   CT HEAD WO CONTRAST (5MM)  Result Date: 05/22/2021 CLINICAL DATA:  Mental status change, unknown cause EXAM: CT HEAD WITHOUT CONTRAST TECHNIQUE: Contiguous axial images were obtained from the base of the skull through the vertex without intravenous contrast. COMPARISON:  Head CT 05/19/2021 FINDINGS: Brain: No evidence of acute intracranial hemorrhage or extra-axial collection.No evidence of mass lesion/concern mass effect.The ventricles are unchanged in size.Confluent periventricular and subcortical white matter hypoattenuation, which is nonspecific but likely sequela of chronic small vessel ischemic disease.Mild cerebral atrophy. Unchanged encephalomalacia in the right MCA territory from prior infarct. Vascular: No hyperdense vessel or unexpected  calcification. Skull: Normal. Negative for fracture or focal lesion. Sinuses/Orbits: The paranasal sinuses are predominantly clear. Other: None. IMPRESSION: No acute intracranial abnormality. Unchanged sequela of chronic small vessel ischemic disease and cerebral atrophy. 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 Electronically Signed   By: Maurine Simmering M.D.   On: 05/22/2021 19:58    Dialysis Orders:  TTS at [facility] 3:30hr, 350/600, EDW 83.5kg, 2K/2Ca, AVF, no heparin - No ESA d/t cancer  Assessment/Plan:  AMS: Severe change at home over the past few weeks, cannot perform ADLs. Ammonia ok. Urinalysis c/w UTI - on abx. MRI brain pending to assure no cancer mets or other acute findings that head CT did not show. Palliative care involved - greatly appreciate their assistance, she will now be DNR but plan is to continue dialysis.  ESRD:  Continue HD per TTS schedule - for HD today.  Hypertension/volume: BP controlled, but with mild dyspnea and edema - UF as tolerated.  Anemia: Hgb 11.2. No ESA d/t ongoing cancer.  Metabolic bone disease: CorrCa high, Phos ok. Last PTH > 1000. No VDRA, use 2Ca bath with HD and add PO sensipar.  Nutrition:  Alb low, adding supplements  Stage 4 RCC with liver mets  Hx DVT: On Eliquis  Veneta Penton, PA-C 05/23/2021, 11:39 AM  [facility]

## 2021-05-23 NOTE — TOC Initial Note (Signed)
Transition of Care [facility]) - Initial/Assessment Note    Patient Details  Name: Sonya Beck MRN: [id number] Date of Birth: May 27, 1951  Transition of Care [facility]) CM/SW Contact:    [address], [place] Phone Number: 05/23/2021, 12:01 PM  Clinical Narrative:                 CSW reached out to son for SNF consult, per son he would like [facility] for pt and nursing if at all possible. He states that he is already working with [facility] and has received approval for a Occupational Health Nurse to  come out to the home for one hour per week as well as a Education officer, museum through. Pt's son states that he is the primary caregiver. Pt's son states that he isn't against the option of SNF but would like to explore options of pt coming home first, [facility] notified.         Patient Goals and CMS Choice        Expected Discharge Plan and Services                                                Prior Living Arrangements/Services                       Activities of Daily Living Home Assistive Devices/Equipment: Gilford Rile (specify type), Grab bars in shower ADL Screening (condition at time of admission) Patient's cognitive ability adequate to safely complete daily activities?: Yes Is the patient deaf or have difficulty hearing?: No Does the patient have difficulty seeing, even when wearing glasses/contacts?: Yes Does the patient have difficulty concentrating, remembering, or making decisions?: No Patient able to express need for assistance with ADLs?: No Does the patient have difficulty dressing or bathing?: Yes Independently performs ADLs?: Yes (appropriate for developmental age) Does the patient have difficulty walking or climbing stairs?: Yes Weakness of Legs: Both Weakness of Arms/Hands: Both  Permission Sought/Granted                  Emotional Assessment              Admission diagnosis:  Confusion [R41.0] Weakness [R53.1] Metastatic malignant neoplasm,  unspecified site ([place]) [C79.9] AMS (altered mental status) [G18.29] Acute metabolic encephalopathy [H37.16] Patient Active Problem List   Diagnosis Date Noted   Pressure injury of skin 05/23/2021   Urinary tract infection 96/78/9381   Acute metabolic encephalopathy 01/75/1025   Confusion    AMS (altered mental status) 05/22/2021   Metastatic malignant neoplasm ([place])    Weakness    Intractable vomiting with nausea, unspecified vomiting type 04/07/2021   Intractable nausea and vomiting 04/06/2021   Epigastric pain    Encounter to establish care 03/18/2021   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   Severe aortic stenosis 03/18/2021   Gastroesophageal reflux disease without esophagitis 03/18/2021   History of DVT of lower extremity 03/18/2021   Volume overload 03/18/2021   Metastatic renal cell carcinoma to liver ([place]) 03/18/2021   Fibromyalgia 03/18/2021   History of stroke 03/18/2021   ESRD on hemodialysis ([place]) 03/18/2021   Essential hypertension 01/16/2021   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

## 2021-05-23 NOTE — Progress Notes (Signed)
Received message from [place], SW, reports that pt's son wants to take pt home.  Contacted pt's son [name]). He reports that his mother lives with him. He reports that he is going back to work. Discussed PT recommendations for SNF or home with 24 hr supervision. He wants his mother to return home. He is not interested in SNF. He is hoping that her mother gets better and she can return home. Discussed home health aide and encouraged son to contact Medicaid and discuss the need for an aide. He doesn't have the Medicaid card, provided son with the Medicaid phone # in [place], [place].   He reports that his mother has a tub bench and a bedside commode. Pt is active with [facility]. He reports that [facility] did 3 evaluations before she came to the hospital.   PT is recommending a W/C with cushion. Pt needs a hospital bed and orders to continue [facility] and MD notified.   Will continue to f/u to assist with the D/C plan.

## 2021-05-23 NOTE — Progress Notes (Signed)
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  Son reported that since 8/15 after appointment with her oncologist, her personality has drastically change and often throws tantrums and falls onto the floor.  She has not had any additional medications in that time.  And was advised by her home health agencies to call EMS when she throws herself on the floor and unable to get back up.  Per son, at baseline she is able to ambulate with a rolling walker; but apparently only when she wants to.  Patient does not complain of any pain or current concerns.  She does still make urine.  Patient's son requests psychiatric evaluation due to her personality change.  In the ED, her 97.9 F, HR 81, RR 16, BP 96/76, SPO2 1% on room air.  144, potassium 4.0, chloride 98, CO2 30, glucose 117, BUN 68, creatinine 5.19, albumin 3.2, AST 25, ALT 21, total bilirubin 1.1.  WBC 4.1, hemoglobin 11.2, platelets 148.  Lipase 84.  INR 1.2.  Ammonia level level 12.  COVID-19/SARS-CoV-2 negative.  CT head without contrast with no acute intercranial normality, unchanged sequelae of chronic small vessel ischemic disease and cerebral atrophy.  Urinalysis with moderate leukocytes, negative nitrite, many bacteria, greater than 50 WBCs.  Duration consulted for further evaluation and management of acute metabolic encephalopathy concerning for UTI versus worsening metastatic disease vs developing dementia.   Assessment & Plan:   Principal Problem:   AMS (altered mental status) Active Problems:   History of DVT of lower extremity   Metastatic renal cell carcinoma to liver (HCC)   ESRD  on hemodialysis (HCC)   Weakness   Pressure injury of skin   Urinary tract infection   Acute metabolic encephalopathy, POA Patient presenting to the ED from home with progressive confusion and mental status changes.  Patient is afebrile without leukocytosis.  Urinalysis consistent with UTI as she still makes urine although on HD.  Also considerations for contributing factors of her underlying metastatic renal cell carcinoma with hepatic involvement with possible brain mets although CT head with no significant findings.  Also has cerebral atrophy on CT head, may be developing mild dementia.  We will continue treatment as below for UTI.  Urinary tract infection Urinalysis with moderate leukocytes, negative nitrite, many bacteria and greater than 50 WBCs.  Patient is afebrile without leukocytosis.  Could be a contributing factor to her confusion. --Blood cultures x2: Pending --Urine culture: Pending --Ceftriaxone 1 g IV q24h  History of metastatic renal cell carcinoma with hepatic involvement Patient follows with medical oncology, Dr. Alen Blew outpatient.  Originally diagnosed February 2022.  Patient previously received ipilimumab and nivolumab 1 cycle in June 2022 while living in [place] which was discontinued due to poor tolerance.  Most recently started on Cabometyx July 2022 with therapy currently on hold due to poor toleration with GI toxicity including nausea/vomiting.  Given her metastatic process and poor toleration with chemotherapy agents x2, overall prognosis is poor. --Palliative care consulted and following, now DNR --Checking MR brain with/without contrast to evaluate for brain mets contributing to her AMS as above --Supportive care, likely anticipate hospice in the near  future versus outpatient palliative care to follow  ESRD on HD TTS Follows with nephrology outpatient, dialyzes at [facility] clinic. --Nephrology consulted for continued HD while inpatient  Hx DVT --Eliquis reduced  to 2.5 mg p.o. twice daily for age/renal function per pharmacy  Buttocks, stage I pressure injury of skin, POA Pressure Injury 05/23/21 Buttocks Mid Stage 1 -  Intact skin with non-blanchable redness of a localized area usually over a bony prominence. (Active)  05/23/21 0200  Location: Buttocks  Location Orientation: Mid  Staging: Stage 1 -  Intact skin with non-blanchable redness of a localized area usually over a bony prominence.  Wound Description (Comments):   Present on Admission: Yes  --Wound care consult, continue local wound care, offloading  GERD: Continue Protonix 40 mg p.o. daily  Weakness/debility/deconditioning: --PT/OT evaluation   DVT prophylaxis: apixaban (ELIQUIS) tablet 2.5 mg Start: 05/23/21 2200 apixaban (ELIQUIS) tablet 2.5 mg    Code Status: DNR Family Communication: Updated patient's son Alvie Heidelberg via telephone this morning  Disposition Plan:  Level of care: Med-Surg Status is: Observation  The patient remains OBS appropriate and will d/c before 2 midnights.  Dispo: The patient is from: Home              Anticipated d/c is to:  To be determined              Patient currently is not medically stable to d/c.  Pending further work-up with MRI brain and urine culture results   Difficult to place patient No   Consultants:  Nephrology, Dr. Candiss Norse Palliative care   Procedures:  None  Antimicrobials:  Ceftriaxone 9/3>>   Subjective: Patient seen examined at bedside, resting comfortably.  Sitting in bedside chair.  RN present.  Requesting bedside commode.  Patient very short in her answers, but appears alert and oriented currently.  Discussed with her urinalysis findings and initiation of antibiotics.  Also will obtain MRI brain to ensure no further metastasis causing her confusion.  No other questions or concerns at this time.  Denies headache, no chest pain, no shortness of breath, no abdominal pain, no fever/chills/night sweats, no  nausea/vomiting/diarrhea.  No acute events overnight per nursing staff.  Objective: Vitals:   05/23/21 0030 05/23/21 0141 05/23/21 0256 05/23/21 0823  BP: (!) 100/53 100/79 106/65   Pulse: 81 82 82 80  Resp: 16 (!) 23 18 20   Temp:   98.3 F (36.8 C) 97.9 F (36.6 C)  TempSrc:   Oral Oral  SpO2: 95% 97% 96%   Weight:      Height:        Intake/Output Summary (Last 24 hours) at 05/23/2021 1103 Last data filed at 05/23/2021 0929 Gross per 24 hour  Intake 240 ml  Output 20 ml  Net 220 ml   Filed Weights   05/22/21 1546  Weight: 84 kg    Examination:  General exam: Appears calm and comfortable, chronically ill in appearance Respiratory system: Clear to auscultation. Respiratory effort normal.  On room air Cardiovascular system: S1 & S2 heard, RRR. No JVD, murmurs, rubs, gallops or clicks. No pedal edema. Gastrointestinal system: Abdomen is nondistended, soft and nontender. No organomegaly or masses felt. Normal bowel sounds heard. Central nervous system: Alert and oriented to person/place/time. No focal neurological deficits. Extremities: Symmetric 5 x 5 power. Skin: No rashes, lesions or ulcers Psychiatry: Judgement and insight appear poor. Mood & affect appropriate.     Data Reviewed: I have personally reviewed following labs and imaging studies  CBC: Recent Labs  Lab 05/19/21 0820 05/22/21 1552  WBC 4.7 4.1  NEUTROABS 3.2  --   HGB 12.0 11.2*  HCT 38.1 35.6*  MCV 102.7* 103.2*  PLT 108* 324*   Basic Metabolic Panel: Recent Labs  Lab 05/19/21 0820 05/22/21 1552 05/23/21 0111  NA 141 144 142  K 4.0 4.0 3.9  CL 98 98 99  CO2 26 30 28   GLUCOSE 122* 117* 155*  BUN 65* 68* 74*  CREATININE 5.03* 5.19* 5.29*  CALCIUM 10.4* 10.3 10.4*  PHOS  --   --  5.0*   GFR: Estimated Creatinine Clearance: 10.1 mL/min (A) (by C-G formula based on SCr of 5.29 mg/dL (H)). Liver Function Tests: Recent Labs  Lab 05/19/21 0820 05/22/21 1552 05/23/21 0111  AST 26 25  --    ALT 23 21  --   ALKPHOS 79 80  --   BILITOT 1.6* 1.1  --   PROT 7.2 6.8  --   ALBUMIN 3.4* 3.2* 3.1*   Recent Labs  Lab 05/22/21 1947  LIPASE 84*   Recent Labs  Lab 05/22/21 1947  AMMONIA 12   Coagulation Profile: Recent Labs  Lab 05/19/21 0820 05/22/21 1947  INR 1.2 1.2   Cardiac Enzymes: No results for input(s): CKTOTAL, CKMB, CKMBINDEX, TROPONINI in the last 168 hours. BNP (last 3 results) No results for input(s): PROBNP in the last 8760 hours. HbA1C: No results for input(s): HGBA1C in the last 72 hours. CBG: Recent Labs  Lab 05/22/21 2217  GLUCAP 103*   Lipid Profile: No results for input(s): CHOL, HDL, LDLCALC, TRIG, CHOLHDL, LDLDIRECT in the last 72 hours. Thyroid Function Tests: No results for input(s): TSH, T4TOTAL, FREET4, T3FREE, THYROIDAB in the last 72 hours. Anemia Panel: No results for input(s): VITAMINB12, FOLATE, FERRITIN, TIBC, IRON, RETICCTPCT in the last 72 hours. Sepsis Labs: No results for input(s): PROCALCITON, LATICACIDVEN in the last 168 hours.  Recent Results (from the past 240 hour(s))  SARS CORONAVIRUS 2 (TAT 6-24 HRS) Nasopharyngeal Nasopharyngeal Swab     Status: None   Collection Time: 05/23/21  1:08 AM   Specimen: Nasopharyngeal Swab  Result Value Ref Range Status   SARS Coronavirus 2 NEGATIVE NEGATIVE Final    Comment: (NOTE) SARS-CoV-2 target nucleic acids are NOT DETECTED.  The SARS-CoV-2 RNA is generally detectable in upper and lower respiratory specimens during the acute phase of infection. Negative results do not preclude SARS-CoV-2 infection, do not rule out co-infections with other pathogens, and should not be used as the sole basis for treatment or other patient management decisions. Negative results must be combined with clinical observations, patient history, and epidemiological information. The expected result is Negative.  Fact Sheet for Patients: SugarRoll.be  Fact Sheet for  Healthcare Providers: https://www.woods-mathews.com/  This test is not yet approved or cleared by the [place] FDA and  has been authorized for detection and/or diagnosis of SARS-CoV-2 by FDA under an Emergency Use Authorization (EUA). This EUA will remain  in effect (meaning this test can be used) for the duration of the COVID-19 declaration under Se ction 564(b)(1) of the Act, 21 U.S.C. section 360bbb-3(b)(1), unless the authorization is terminated or revoked sooner.  Performed at [facility], [place] [address] [place], [place] [address]          Radiology Studies: DG Thoracic Spine 2 View  Result Date: 05/22/2021 CLINICAL DATA:  Fall. EXAM: THORACIC SPINE 2 VIEWS COMPARISON:  Reformats from chest CT 3 days ago. FINDINGS: Twelve thoracic  type vertebra. No evidence of fracture. Mild diffuse degenerative disc disease. Vertebral body heights are normal. Cardiomegaly which is similar to recent CT. No paravertebral soft tissue abnormality. IMPRESSION: Mild diffuse degenerative disc disease without acute fracture. Electronically Signed   By: Keith Rake M.D.   On: 05/22/2021 19:41   DG Lumbar Spine Complete  Result Date: 05/22/2021 CLINICAL DATA:  Post fall. EXAM: LUMBAR SPINE - COMPLETE 4+ VIEW COMPARISON:  Reformats from abdominopelvic CT 3 days ago 05/19/2021 FINDINGS: Bones are subjectively under mineralized. No acute fracture. Vertebral body heights are normal. Multilevel degenerative disc disease. Multilevel facet hypertrophy. Degenerative change of the sacroiliac joints which remain congruent. Vascular calcifications are seen. IMPRESSION: 1. No acute fracture. 2. Multilevel degenerative disc disease and facet hypertrophy. Electronically Signed   By: Keith Rake M.D.   On: 05/22/2021 20:17   CT HEAD WO CONTRAST (5MM)  Result Date: 05/22/2021 CLINICAL DATA:  Mental status change, unknown cause EXAM: CT HEAD WITHOUT CONTRAST TECHNIQUE: Contiguous axial  images were obtained from the base of the skull through the vertex without intravenous contrast. COMPARISON:  Head CT 05/19/2021 FINDINGS: Brain: No evidence of acute intracranial hemorrhage or extra-axial collection.No evidence of mass lesion/concern mass effect.The ventricles are unchanged in size.Confluent periventricular and subcortical white matter hypoattenuation, which is nonspecific but likely sequela of chronic small vessel ischemic disease.Mild cerebral atrophy. Unchanged encephalomalacia in the right MCA territory from prior infarct. Vascular: No hyperdense vessel or unexpected calcification. Skull: Normal. Negative for fracture or focal lesion. Sinuses/Orbits: The paranasal sinuses are predominantly clear. Other: None. IMPRESSION: No acute intracranial abnormality. Unchanged sequela of chronic small vessel ischemic disease and cerebral atrophy. 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 Electronically Signed   By: Maurine Simmering M.D.   On: 05/22/2021 19:58        Scheduled Meds:  apixaban  2.5 mg Oral BID   Chlorhexidine Gluconate Cloth  6 each Topical Q0600   Continuous Infusions:  cefTRIAXone (ROCEPHIN)  IV 1 g (05/23/21 0929)     LOS: 0 days    Time spent: 39 minutes spent on chart review, discussion with nursing staff, consultants, updating family and interview/physical exam; more than 50% of that time was spent in counseling and/or coordination of care.    Ashton Belote J [place] DO [facility] Available via Epic secure chat 7am-7pm After these hours, please refer to coverage provider listed on [email or web address] 05/23/2021, 11:03 AM

## 2021-05-23 NOTE — ED Notes (Signed)
Called to room and found pt had removed her PIV, removed the monitor and spilled milk down her gown. Pt gown changed, placed back on monitor

## 2021-05-23 NOTE — Evaluation (Signed)
Physical Therapy Evaluation Patient Details Name: Sonya Beck MRN: [id number] DOB: 09/03/1951 Today's Date: 05/23/2021   History of Present Illness  Pt is a 70 y/o female presenting to ED on 6/29 with complaints of shortness of breath and edema. CXR shows cardiomegaly, vascular congestion. 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   Clinical Impression  Pt in bed upon arrival of PT, agreeable to evaluation at this time. Prior to admission the pt reports she was ambulating with 4-wheel walker, but reports recently needing assist from her son and his friend to complete sit-stand transfers and mobility at home. The pt's son was not present at this time to confirm PLOF. The pt now presents with limitations in functional mobility, strength, power, endurance, and dynamic stability due to above dx, and will continue to benefit from skilled PT to address these deficits. The pt was able to complete sit-stand transfer and pivoting steps with modA of 2 for safety at this time despite pt reports of LE weakness so severe she did not think she would be able to stand. Following mobility the pt reports she was surprised by her ability to move and is motivated to continue. She will benefit from continued efforts to progress LE strength, mobility, and activity tolerance to reduce caregiver burden and allow for d/c home. Will continue to assess d/c needs, but given the pt's current deficits in strength, activity tolerance, and need for assistance, recommend short stint SNF to regain some strength prior to return home unless pt's son can provide 24/7 assist and supervision.      Follow Up Recommendations SNF;Supervision/Assistance - 24 hour (unless son can provide 24/7 supervision/assist)    Equipment Recommendations  3in1 (PT);Wheelchair  (measurements PT);Wheelchair cushion (measurements PT)    Recommendations for Other Services       Precautions / Restrictions Precautions Precautions: Fall;Other (comment) (pt with bruising on LLE) Precaution Comments: Pt reported on how BLE have been heavy Restrictions Weight Bearing Restrictions: No      Mobility  Bed Mobility Overal bed mobility: Needs Assistance             General bed mobility comments: pt presenting sitting on EOB    Transfers Overall transfer level: Needs assistance Equipment used: Rolling walker (2 wheeled) Transfers: Sit to/from Stand Sit to Stand: Mod assist;+2 physical assistance;+2 safety/equipment;From elevated surface         General transfer comment: modA with increased time to power up, able to steady with RW  Ambulation/Gait Ambulation/Gait assistance: Min assist;+2 safety/equipment Gait Distance (Feet): 5 Feet Assistive device: Rolling walker (2 wheeled) Gait Pattern/deviations: Step-to pattern;Decreased stride length Gait velocity: decreased Gait velocity interpretation: <1.31 ft/sec, indicative of household ambulator General Gait Details: pt with short lateral steps to R towards recliner. minimal clearance, declines further distance due to reports of LE fatigue     Balance Overall balance assessment: Needs assistance Sitting-balance support: Feet supported Sitting balance-Leahy Scale: Fair     Standing balance support: Bilateral upper extremity supported Standing balance-Leahy Scale: Poor Standing balance comment: Pt fearful of BLE weakness, BUE support to remain steady                             Pertinent Vitals/Pain Pain Assessment: Faces Faces Pain Scale: Hurts a little bit Pain Location: touch to BLE as edema noted Pain Descriptors /  Indicators: Aching Pain Intervention(s): Limited activity within patient's tolerance;Monitored during session;Repositioned    Home Living Family/patient expects to be  discharged to:: Private residence Living Arrangements: Children Available Help at Discharge: Family;Available PRN/intermittently Type of Home: House Home Access: Ramped entrance     Home Layout: Multi-level;Able to live on main level with bedroom/bathroom Home Equipment: Walker - 4 wheels;Shower seat;Cane - single point;Hand held shower head;Electric scooter Additional Comments: pt reports she has "access to [facility]" in bathroom.    Prior Function Level of Independence: Needs assistance   Gait / Transfers Assistance Needed: walking with rollator, pt states she is mostly in bed at home. has reccently required assist from her son and his friend to stand  ADL's / Homemaking Assistance Needed: assist from son for IADLs and "some" assist with ADLs  Comments: Pt reports ambulating in her home and outdoors primarily using SPC.     Hand Dominance   Dominant Hand: Right    Extremity/Trunk Assessment   Upper Extremity Assessment Upper Extremity Assessment: Defer to OT evaluation    Lower Extremity Assessment Lower Extremity Assessment: Generalized weakness (gen weakness with pitting edema from thigh to foot.)    Cervical / Trunk Assessment Cervical / Trunk Assessment: Kyphotic  Communication   Communication: No difficulties  Cognition Arousal/Alertness: Awake/alert Behavior During Therapy: Agitated;Flat affect (pt at the start of session was slightly agitated as reproting "that is all you are getting from me" and you are going to throw me off the cliff" but then engaged in more activity and reported "thank you" at the end of session) Overall Cognitive Status: No family/caregiver present to determine baseline cognitive functioning                                 General Comments: pt with noted "tantrums" PTA, recent changes in behavior and confusion with son requesting psych consult. pt able to follow commands with increased time and encouragement      General Comments  General comments (skin integrity, edema, etc.): VSS on RA    Exercises     Assessment/Plan    PT Assessment Patient needs continued PT services  PT Problem List Decreased range of motion;Decreased activity tolerance;Decreased balance;Decreased strength;Decreased mobility;Decreased cognition;Decreased safety awareness       PT Treatment Interventions DME instruction;Gait training;Stair training;Functional mobility training;Therapeutic activities;Therapeutic exercise;Balance training;Patient/family education    PT Goals (Current goals can be found in the Care Plan section)  Acute Rehab PT Goals Patient Stated Goal: to have breakfast PT Goal Formulation: With patient Time For Goal Achievement: 06/06/21 Potential to Achieve Goals: Good    Frequency Min 3X/week    Co-evaluation PT/OT/SLP Co-Evaluation/Treatment: Yes Reason for Co-Treatment: Necessary to address cognition/behavior during functional activity;For patient/therapist safety;To address functional/ADL transfers PT goals addressed during session: Mobility/safety with mobility;Balance;Strengthening/ROM OT goals addressed during session: ADL's and self-care       AM-PAC PT "6 Clicks" Mobility  Outcome Measure Help needed turning from your back to your side while in a flat bed without using bedrails?: A Little Help needed moving from lying on your back to sitting on the side of a flat bed without using bedrails?: A Little Help needed moving to and from a bed to a chair (including a wheelchair)?: A Lot Help needed standing up from a chair using your arms (e.g., wheelchair or bedside chair)?: A Lot Help needed to walk in hospital room?: A Lot Help needed climbing 3-5 steps  with a railing? : A Lot 6 Click Score: 14    End of Session Equipment Utilized During Treatment: Gait belt Activity Tolerance: Patient tolerated treatment well Patient left: in chair;with call bell/phone within reach;with chair alarm set Nurse  Communication: Mobility status PT Visit Diagnosis: Other abnormalities of gait and mobility (R26.89);Repeated falls (R29.6);Muscle weakness (generalized) (M62.81)    Time: 8185-6314 PT Time Calculation (min) (ACUTE ONLY): 24 min   Charges:   PT Evaluation $PT Eval Low Complexity: 1 Low          West Carbo, PT, DPT   Acute Rehabilitation Department Pager #: [phone]  Sandra Cockayne 05/23/2021, 11:01 AM

## 2021-05-23 NOTE — Progress Notes (Addendum)
HOSPITAL MEDICINE OVERNIGHT EVENT NOTE    Notified by nursing that patient's MRI results are back revealing two punctate foci of acute ischemia within the left MCA territory.  There is no evidence of metastatic disease to the brain.  Per my discussion with nursing, patient's mentation has improved since hospitalization.  Patient exhibits no evidence of speech deficit, headache or focal neurologic deficit.  Considering these tiny punctate foci appear acute I discussed the case with Dr. Lorrin Goodell with neurology.  He graciously has agreed to come evaluate the patient in consultation.  He recommends that Eliquis not be held and the antiplatelet therapy not be initiated.  He recommends pursuing a usual stroke work-up with serial neurologic checks including PT, OT, SLP evaluation in the morning, telemetry monitoring.  I have asked nursing to notify me if patient exhibits any neurologic change.  Vernelle Emerald  MD [facility]   ADDENDUM 3:15am 9/4  Notified by nursing that patient has returned from hemodialysis exhibiting a red MEWS score due to ongoing tachypnea and hypotension.  Review of the chart reveals the patient has exhibited low blood pressures, many times with systolics in the 35K, throughout the hospitalization.  Nursing reports the patient has appeared to be quite short of breath since she arrived back from hemodialysis.  Mentation is unchanged.  We will obtain stat chest x-ray and ABG to better evaluate.  Finally, nursing reports the patient is failed her swallow screen.  All medications have either been switched to intravenous formulations were discontinued until a speech therapy evaluation can be performed tomorrow.  Sherryll Burger Malynn Lucy

## 2021-05-23 NOTE — Consult Note (Signed)
Palliative Medicine Inpatient Consult Note  Reason for consult:  son requested, outpatient palliative meeting planned for 05/29/2021  HPI:  Per intake H&P by Doristine Mango, MS on 05/22/2021 --> "Allyana Vogan is a 70 y.o. 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 They presented from home via EMS to the ED on 05/22/2021. Patient was unable to provide a reliable history so spoke with son [name] over the phone. He states that she has had a generalized state of confusion since 8/15 after her appointment with her oncologist. Her personality has drastically changed. She often "throws tantrums" and onto the floor. She has not had any additional medications in that time period. He was advised by [facility] to call EMS when she throws herself to the floor and he's not able to get her back up again so that is why he called EMS today. He would like for her to have a psych evaluation. He states that at baseline, she is able to ambulate with a rolling walker but only when she wants to. Currently, patient thinks that she is in an ambulance and that she called EMS for "several big issues". She does not complain of any pain or current concerns. She states that she does still make urine. Denies any medications other than eliquis and her son corroborates that information. Patient was admitted to medicine service for further workup and management of AMS as outlined in detail below.   Clinical Assessment/Goals of Care: I have reviewed medical records including EPIC notes, labs and imaging, received report from bedside RN, Samella Parr, and assessed the patient.    I met with Ms. Schroth  in her room and then with her son, Katura Eatherly, to further discuss diagnosis prognosis, GOC, EOL wishes, disposition and options. Ms. Cothran was  sitting up in chair,  therapy had just finished their session. She ate 100% of her meal tray while we were talking.  She was oriented to person only.    I introduced Palliative Medicine as specialized medical care for people living with serious illness. It focuses on providing relief from the symptoms and stress of a serious illness. The goal is to improve quality of life for both the patient and the family.  Ms. 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 She thought she was diagnosed with cancer 2 days ago, yet said the doctors say it is stabilized and not growing." She has one son [name]. She denied pain, SOB or other symptoms today.  Patient lives at home with son, [name] and Legrand Como (son's fiend) who has lived with them since the age of 58. [name] states Hurley Cisco is essentially a second son.   [name] states he  is her POA but he is having difficulty finding the paperwork. He states they never did advanced directive paperwork. Her son shared that he grew up in [place] and they had moved to [place]. His mother had a stroke 8 years ago from a clot associated with renal cancer. She had 1 1/2 kidneys removed and was deemed cancer free. Three years later she had endometrial cancer and had a complete hysterectomy and again was deemed cancer free. In May 2022 she was again diagnosed with renal cancer with liver and  stomach involvement. She was started ion immunotherapy, which made her feel horrible. They were in the process of moving to [place] when this happened and she only received 2 treatments. (Per oncology note she received Ipilimumab and nivolumab 1 cycle but it was discontinued due to poor tolerance.) She was seen at [facility] by Dr. Alen Blew in July  and was changed to a pill, Cabometyx (40 mg daily). She also felt really bad on this treatment,  with nausea and vomiting. She was on it for 1 1/2 weeks and stopped it  around August 12th. They saw Dr. Alen Blew on August 15th and the son's interpretation was her test results were great and the cancer had backed off and that she could have it for 20 years and it not kill her. He states at that this appointment was his identified turning point in his mother's behaviors. She became angry, said she is sick, and started to shut down, and there is lack of eye contact. She started to not bathe or clean herself. He states she will refuse to walk and just slide to the floor. [facility] has come in to evaluate her but she has not received much treatment at home year. He says a case Freight forwarder, OT and social worker have been in.  He states he has pulled muscles and it is hard to lift his mother. That she is very angry and he is not sure if she has had a nervous breakdown, if behaviors are a side effect of treatment, or dementia. He states he did not notice any of these behaviors before the 15th but perhaps with the move and taking care of his mother he was just not seeing them. He says the person she is now, is not his mother. He was very tearful during our conversation, stating, "I just want to get my mom back."  He says she has tantrums and will make threats to what she wants or she will lay on the floor. For example, if her does not pick her up and move her to where she wants to go.  He says she will soil herself in the truck on the way to dialysis and place her hands in her pants, scratch her butt and then have feces on her hands. He says it is embarrassing to drop her off for dialysis this was when he has cleaned her up 3 times prior to taking her. He states his mother was always a clean person. He says OT did an evaluation in the home and she got up and walked to the kitchen table but when OT left she refused to walk to move back in to the other room, after she had just walked to the kitchen. They sat for three hours and she would not move. He shared she will removed her diapers and he will  find them under her pillow.The son is bathing his mother and making all her meals adhering to the renal diet needs.  Her son has his own health concerns with cardiac disease, heart failure, has 3 stents, was recently hospitalized for week with a stroke,  heart failure and diabetes. He states he needs to work to take care of her, but has been unable to keep a job due to her care needs. He has an opportunity to be a Copywriter, advertising but that is on hold now. Her son has past experience in EMS and has medical knowledge. He is "fried, broken, exhausted, and sore" as he  describes it, but he wants to keep his mother at home and to care for her. He states, " We are all we have. "  His Godmother lives nearby and is supportive emotionally.   A detailed discussion was had today regarding advanced directives.  Concepts specific to code status, artifical feeding and hydration, continued IV antibiotics and rehospitalization was had.  The difference between a aggressive medical intervention path  and a palliative comfort care path for this patient at this time was had. Values and goals of care important to patient and family were attempted to be elicited. [name] shared that his mom would not want to be coded, DNR/DNI and allow a natural death would be adhering to her wishes.   He states she would not want hospice care as that would stop dialysis. He hopes there can be a mental health evaluation with this admission. We discussed the UTI diagnosis and possible mental status changes that can occur. He is aware that she has a head MRI ordered for today. Continued conversations can occur once this admission workup results are more clear.  Discussed the importance of continued conversation with family and their  medical providers regarding overall plan of care and treatment options, ensuring decisions are within the context of the patients values and GOCs. He has the palliative number to call when he does come in  Sunday or later so we can complete a MOST form with him.   Decision Maker: son, Dalisa Forrer [phone]  SUMMARY OF RECOMMENDATIONS    Code Status/Advance Care Planning:  DNAR/DNI- status changed  Symptom Management:  Nausea- ondansetron prn Weakness- OT and PT eval and treat   Additional Recommendations (Limitations, Scope, Preferences): Continue Dialysis routine Continued emotional support for son    Psycho-social/Spiritual:  Desire for further Chaplaincy support: Spiritual consult place, Catholic Additional Recommendations: Need for psych evaluation if medically cleared to evaluate AMS.    Discharge Planning: son's goal is home with him. Need to explore support services as he states she has Medicare and Medicaid.    Vitals with BMI 05/23/2021 05/23/2021 05/23/2021  Height - - -  Weight - - -  BMI - - -  Systolic - 027 253  Diastolic - 65 79  Pulse 80 82 82     PPS: 40%   This conversation/these recommendations were discussed with patient primary care team, Dr. [place] via secure chat.  Thank you for the opportunity to participate in the care of this patient and family.   Time: 8:45-1030 Total Time: >70 minutes Greater than 50%  of this time was spent counseling and coordinating care related to the above assessment and plan.  Lindell Spar, NP [facility] Palliative Medicine Team Team Cell Phone: [phone] Please utilize secure chat with additional questions, if there is no response within 30 minutes please call the above phone number  Palliative Medicine Team providers are available by phone from 7am to 7pm daily and can be reached through the team cell phone.  Should this patient require assistance outside of these hours, please call the patient's attending physician.

## 2021-05-24 ENCOUNTER — Inpatient Hospital Stay (HOSPITAL_COMMUNITY): Payer: Medicare Other

## 2021-05-24 ENCOUNTER — Encounter (HOSPITAL_COMMUNITY): Payer: Medicare Other

## 2021-05-24 DIAGNOSIS — Z66 Do not resuscitate: Secondary | ICD-10-CM

## 2021-05-24 DIAGNOSIS — I6330 Cerebral infarction due to thrombosis of unspecified cerebral artery: Secondary | ICD-10-CM | POA: Diagnosis present

## 2021-05-24 DIAGNOSIS — G9341 Metabolic encephalopathy: Secondary | ICD-10-CM

## 2021-05-24 DIAGNOSIS — I639 Cerebral infarction, unspecified: Principal | ICD-10-CM

## 2021-05-24 LAB — BLOOD GAS, ARTERIAL
Acid-Base Excess: 2.6 mmol/L — ABNORMAL HIGH (ref 0.0–2.0)
Bicarbonate: 25.5 mmol/L (ref 20.0–28.0)
Drawn by: 34719
FIO2: 32
O2 Saturation: 99.4 %
Patient temperature: 36.4
pCO2 arterial: 31.2 mmHg — ABNORMAL LOW (ref 32.0–48.0)
pH, Arterial: 7.521 — ABNORMAL HIGH (ref 7.350–7.450)
pO2, Arterial: 129 mmHg — ABNORMAL HIGH (ref 83.0–108.0)

## 2021-05-24 LAB — LIPID PANEL
Cholesterol: 112 mg/dL (ref 0–200)
HDL: 27 mg/dL — ABNORMAL LOW (ref >40)
LDL Cholesterol: 65 mg/dL (ref 0–99)
Total CHOL/HDL Ratio: 4.1 RATIO
Triglycerides: 102 mg/dL (ref <150)
VLDL: 20 mg/dL (ref 0–40)

## 2021-05-24 LAB — GLUCOSE, CAPILLARY: Glucose-Capillary: 111 mg/dL — ABNORMAL HIGH (ref 70–99)

## 2021-05-24 LAB — APTT: aPTT: 37 seconds — ABNORMAL HIGH (ref 24–36)

## 2021-05-24 LAB — MRSA NEXT GEN BY PCR, NASAL: MRSA by PCR Next Gen: NOT DETECTED

## 2021-05-24 MED ORDER — GLYCOPYRROLATE 0.2 MG/ML IJ SOLN: 0.2000 mg | INTRAMUSCULAR | Status: DC | PRN

## 2021-05-24 MED ORDER — BIOTENE DRY MOUTH MT LIQD: 15.0000 mL | OROMUCOSAL | Status: DC | PRN

## 2021-05-24 MED ORDER — ACETAMINOPHEN 650 MG RE SUPP: 650.0000 mg | Freq: Four times a day (QID) | RECTAL | Status: DC | PRN

## 2021-05-24 MED ORDER — POLYVINYL ALCOHOL 1.4 % OP SOLN
1.0000 [drp] | Freq: Four times a day (QID) | OPHTHALMIC | Status: DC | PRN
  Filled 2021-05-24: qty 15

## 2021-05-24 MED ORDER — GLYCOPYRROLATE 1 MG PO TABS
1.0000 mg | ORAL_TABLET | ORAL | Status: DC | PRN
  Filled 2021-05-24: qty 1

## 2021-05-24 MED ORDER — IPRATROPIUM-ALBUTEROL 0.5-2.5 (3) MG/3ML IN SOLN
3.0000 mL | RESPIRATORY_TRACT | Status: DC | PRN
  Administered 2021-05-24: 3 mL via RESPIRATORY_TRACT
  Filled 2021-05-24: qty 3

## 2021-05-24 MED ORDER — ACETAMINOPHEN 325 MG PO TABS: 650.0000 mg | ORAL_TABLET | Freq: Four times a day (QID) | ORAL | Status: DC | PRN

## 2021-05-24 MED ORDER — ARGATROBAN 50 MG/50ML IV SOLN
0.9500 ug/kg/min | INTRAVENOUS | Status: DC
  Administered 2021-05-24: 0.75 ug/kg/min via INTRAVENOUS
  Filled 2021-05-24 (×2): qty 50

## 2021-05-24 MED ORDER — HYDROMORPHONE HCL 1 MG/ML IJ SOLN
0.5000 mg | INTRAMUSCULAR | Status: DC | PRN
  Administered 2021-05-25 – 2021-05-26 (×6): 1 mg via INTRAVENOUS
  Filled 2021-05-24 (×6): qty 1

## 2021-05-24 MED ORDER — LORAZEPAM 2 MG/ML IJ SOLN
0.5000 mg | Freq: Four times a day (QID) | INTRAMUSCULAR | Status: DC | PRN
  Administered 2021-05-24 – 2021-05-25 (×3): 0.5 mg via INTRAVENOUS
  Filled 2021-05-24 (×3): qty 1

## 2021-05-24 MED ORDER — ONDANSETRON HCL 4 MG/2ML IJ SOLN: 4.0000 mg | Freq: Four times a day (QID) | INTRAMUSCULAR | Status: DC | PRN

## 2021-05-24 MED ORDER — PANTOPRAZOLE SODIUM 40 MG IV SOLR
40.0000 mg | INTRAVENOUS | Status: DC
  Administered 2021-05-25 – 2021-05-26 (×2): 40 mg via INTRAVENOUS
  Filled 2021-05-24 (×2): qty 40

## 2021-05-24 NOTE — Progress Notes (Signed)
Text page Dr. Vicente Serene to read MRI results.

## 2021-05-24 NOTE — Evaluation (Signed)
Speech Language Pathology Evaluation Patient Details Name: Calianne Larue MRN: [id number] DOB: 10-Sep-1951 Today's Date: 05/24/2021 Time: 0762-2633 SLP Time Calculation (min) (ACUTE ONLY): 25 min  Problem List:  Patient Active Problem List   Diagnosis Date Noted   Cerebral thrombosis with cerebral infarction 05/24/2021   Pressure injury of skin 05/23/2021   Urinary tract infection 35/45/6256   Acute metabolic encephalopathy 38/93/7342   Confusion    AMS (altered mental status) 05/22/2021   Metastatic malignant neoplasm (HCC)    Weakness    Intractable vomiting with nausea, unspecified vomiting type 04/07/2021   Intractable nausea and vomiting 04/06/2021   Epigastric pain    Encounter to establish care 03/18/2021   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   Severe aortic stenosis 03/18/2021   Gastroesophageal reflux disease without esophagitis 03/18/2021   History of DVT of lower extremity 03/18/2021   Volume overload 03/18/2021   Metastatic renal cell carcinoma to liver ([place]) 03/18/2021   Fibromyalgia 03/18/2021   History of stroke 03/18/2021   ESRD on hemodialysis ([place]) 03/18/2021   Essential hypertension 01/16/2021   Anemia due to end stage renal disease ([place]) 12/31/2020   Past Medical History:  Past Medical History:  Diagnosis Date   Cancer [facility])    Endometrial adenocarcinoma ([place]) 04/17/2018   Fibromyalgia 03/18/2021   Gastroesophageal reflux disease without esophagitis 03/18/2021   History of blood transfusion    History of DVT of lower extremity 03/18/2021   History of stroke 03/18/2021   Metastatic renal cell carcinoma to liver ([place]) 03/18/2021   Severe aortic stenosis 03/18/2021   Stroke [facility])    Past Surgical History:  Past Surgical History:  Procedure Laterality Date   RENAL BIOPSY     HPI:  Cyndia Degraff is a 70 y.o. female with PMH significant for metastatic renal cell cancer s/p partial nephrectomy with recurrence and mets to liver, hx of  endometrial cancer s/p hysterectomy, hx of DVT on eliquis, GERD, prior stroke in 2017 who is admitted with confusion and AMS x 2 weeks. Workup with UA consistent wit ha UTI and being treated with Ceftriaxone. MRI Brain was obtained as part of workup and specifically to evaluate for brain mets and demonstrated 2 punctate strokes in L MCA territory. Pt reportedly lives at home with her son with intermittent supervision.   Assessment / Plan / Recommendation Clinical Impression  Pt demonstrates poor endurance for cognitive testing this am. Her attention waned from focused and briefly sustained to poorly focused attention and delayed responses with multiple repetitions needed for pt to respond to simple Y/N and commands. Her language and speech are WNL, but attention, awareness and short term memory are impaired, most consistent with encephalopathy. Will f/u briefly to retest to determine needs after pt recieves further medical interventions. Currently pt would be a safety risk at home and would need 24/7 supervision, would benefit from short term SNF rehab prior to d/c home.    SLP Assessment  SLP Recommendation/Assessment: Patient needs continued Speech Lanaguage Pathology Services SLP Visit Diagnosis: Dysphagia, unspecified (R13.10)    Follow Up Recommendations  Skilled Nursing facility;24 hour supervision/assistance    Frequency and Duration min 2x/week  2 weeks      SLP Evaluation Cognition  Overall Cognitive Status: No family/caregiver present to determine baseline cognitive functioning Arousal/Alertness: Awake/alert Orientation Level: Oriented to person;Oriented to place;Disoriented to time;Disoriented to situation Attention: Focused;Sustained Focused Attention: Impaired Focused Attention Impairment: Verbal basic;Functional basic Sustained Attention: Impaired Sustained Attention Impairment: Verbal basic;Functional basic Memory: Impaired Memory  Impairment: Decreased short term  memory;Storage deficit Decreased Short Term Memory: Verbal basic Awareness: Impaired Awareness Impairment: Intellectual impairment;Emergent impairment Safety/Judgment: Impaired       Comprehension  Auditory Comprehension Overall Auditory Comprehension: Appears within functional limits for tasks assessed    Expression Verbal Expression Overall Verbal Expression: Appears within functional limits for tasks assessed   Oral / Motor  Oral Motor/Sensory Function Overall Oral Motor/Sensory Function: Mild impairment Facial ROM: Within Functional Limits Facial Symmetry: Within Functional Limits Facial Strength: Within Functional Limits Facial Sensation: Within Functional Limits Lingual ROM: Within Functional Limits Lingual Symmetry: Abnormal symmetry left Lingual Strength: Within Functional Limits Lingual Sensation: Within Functional Limits Velum: Within Functional Limits Mandible: Within Functional Limits Motor Speech Overall Motor Speech: Appears within functional limits for tasks assessed   GO                    Whitt Auletta, Katherene Ponto 05/24/2021, 10:06 AM

## 2021-05-24 NOTE — Consult Note (Addendum)
NEUROLOGY CONSULTATION NOTE   Date of service: May 24, 2021 Patient Name: Sonya Beck MRN:  [id number] DOB:  April 21, 1951 Reason for consult: "Punctate strokes on MRI Brain" Requesting Provider: [place] Eric J, DO _ _ _   _ __   _ __ _ _  __ __   _ __   __ _  History of Present Illness  Sonya Beck is a 70 y.o. female with PMH significant for metastatic renal cell cancer s/p partial nephrectomy with recurrence and mets to liver, hx of endometrial cancer s/p hysterectomy, hx of DVT on eliquis, GERD, prior stroke in 2017 who is admitted with confusion and AMS x 2 weeks. Workup with UA consistent wit ha UTI and being treated with Ceftriaxone.  MRI Brain was obtained as part of workup and specifically to evaluate for brain mets and demonstrated 2 punctate strokes in L MCA territory. Neurology was consulted for further workup for the noted strokes.  I evaluated her right after she was done with her dialysis session. She denies any arm or leg weakness or numbness. She has BL leg swelling and pain and easily get agitated. No headache.  mRS: 4 tPA/thrombectomy: outside window NIHSS components Score: Comment  1a Level of Conscious 0[x]  1[]  2[]  3[]      1b LOC Questions 0[]  1[]  2[x]       1c LOC Commands 0[x]  1[]  2[]       2 Best Gaze 0[x]  1[]  2[]       3 Visual 0[x]  1[]  2[]  3[]      4 Facial Palsy 0[x]  1[]  2[]  3[]      5a Motor Arm - left 0[x]  1[]  2[]  3[]  4[]  UN[]    5b Motor Arm - Right 0[x]  1[]  2[]  3[]  4[]  UN[]    6a Motor Leg - Left 0[]  1[]  2[x]  3[]  4[]  UN[]    6b Motor Leg - Right 0[]  1[]  2[x]  3[]  4[]  UN[]    7 Limb Ataxia 0[x]  1[]  2[]  3[]  UN[]     8 Sensory 0[]  1[x]  2[]  UN[]    Mildly decreased in BL feet  9 Best Language 0[x]  1[]  2[]  3[]      10 Dysarthria 0[x]  1[]  2[]  UN[]      11 Extinct. and Inattention 0[x]  1[]  2[]       TOTAL: 7        ROS   Constitutional Denies weight loss, fever and chills.   HEENT Denies changes in vision and hearing.   Respiratory Denies SOB and cough.    CV Denies palpitations and CP   GI Denies abdominal pain, nausea, vomiting and diarrhea.   GU Denies dysuria and urinary frequency.   MSK Denies myalgia and joint pain.   Skin Denies rash and pruritus.   Neurological Denies headache and syncope.   Psychiatric Denies recent changes in mood. Denies anxiety and depression.    Past History   Past Medical History:  Diagnosis Date   Cancer [facility])    Endometrial adenocarcinoma ([place]) 04/17/2018   Fibromyalgia 03/18/2021   Gastroesophageal reflux disease without esophagitis 03/18/2021   History of blood transfusion    History of DVT of lower extremity 03/18/2021   History of stroke 03/18/2021   Metastatic renal cell carcinoma to liver ([place]) 03/18/2021   Severe aortic stenosis 03/18/2021   Stroke [facility])    Past Surgical History:  Procedure Laterality Date   RENAL BIOPSY     Family History  Problem Relation Age of Onset   Heart disease Neg Hx    Social History   Socioeconomic History  Marital status: Single    Spouse name: Not on file   Number of children: Not on file   Years of education: Not on file   Highest education level: Not on file  Occupational History   Not on file  Tobacco Use   Smoking status: Former    Types: Cigarettes   Smokeless tobacco: Never  Vaping Use   Vaping Use: Never used  Substance and Sexual Activity   Alcohol use: Not Currently   Drug use: Never   Sexual activity: Not Currently    Birth control/protection: Abstinence  Other Topics Concern   Not on file  Social History Narrative   Not on file   Social Determinants of Health   Financial Resource Strain: Not on file  Food Insecurity: Not on file  Transportation Needs: Not on file  Physical Activity: Not on file  Stress: Not on file  Social Connections: Not on file   Allergies  Allergen Reactions   Heparin Other (See Comments)    HITT   Penicillins Swelling    Swelling in mouth     Medications   Medications Prior to Admission   Medication Sig Dispense Refill Last Dose   apixaban (ELIQUIS) 5 MG TABS tablet Take 1 tablet (5 mg total) by mouth 2 (two) times daily. (Patient taking differently: Take 5 mg by mouth See admin instructions. Given at dialysis on Tuesday,Thursday,saturday) 60 tablet 3 05/21/2021 at 1000   iron sucrose in sodium chloride 0.9 % 100 mL Iron Sucrose (Venofer)      Nutritional Supplements (ENSURE ORIGINAL PO) Take 237 mLs by mouth 4 (four) times daily.   05/22/2021   ondansetron (ZOFRAN) 4 MG tablet Take 1 tablet (4 mg total) by mouth every 6 (six) hours as needed for nausea. 20 tablet 0 Past Week   pantoprazole (PROTONIX) 40 MG tablet Take 1 tablet (40 mg total) by mouth daily. (Patient taking differently: Take 40 mg by mouth daily as needed.) 30 tablet 3 Past Week   cabozantinib (CABOMETYX) 40 MG tablet Take 1 tablet (40 mg total) by mouth daily. Take on an empty stomach, 1 hour before or 2 hours after meals. (Patient not taking: Reported on 05/22/2021) 30 tablet 1 Not Taking   promethazine (PHENERGAN) 12.5 MG suppository Place 1 suppository (12.5 mg total) rectally every 6 (six) hours as needed for refractory nausea / vomiting. (Patient not taking: No sig reported) 12 each 0 Not Taking     Vitals   Vitals:   05/23/21 2301 05/23/21 2316 05/23/21 2331 05/23/21 2346  BP: 90/74  (!) 139/112 (!) 102/56  Pulse:      Resp: 18 (!) 28 (!) 24 (!) 35  Temp:      TempSrc:      SpO2:      Weight:      Height:         Body mass index is 31.79 kg/m.  Physical Exam   General: Laying comfortably in bed; in no acute distress.  HENT: Normal oropharynx and mucosa. Normal external appearance of ears and nose.  Neck: Supple, no pain or tenderness  CV: No JVD. No peripheral edema.  Pulmonary: Symmetric Chest rise. Normal respiratory effort. Abdomen: Soft to touch, non-tender.  Ext: No cyanosis, BL lower ext edema and pain, no deformity  Skin: No rash. Normal palpation of skin.   Musculoskeletal: Normal digits  and nails by inspection. No clubbing.   Neurologic Examination  Mental status/Cognition: Alert, oriented to self, place, but not to  month and year, good attention.  Speech/language: Fluent, comprehension intact, object naming intact, repetition intact.  Cranial nerves:   CN II Pupils equal and reactive to light, no VF deficits    CN III,IV,VI EOM intact, no gaze preference or deviation, no nystagmus   CN V normal sensation in V1, V2, and V3 segments bilaterally    CN VII no asymmetry, no nasolabial fold flattening    CN VIII normal hearing to speech    CN IX & X normal palatal elevation, no uvular deviation    CN XI 5/5 head turn and 5/5 shoulder shrug bilaterally    CN XII midline tongue protrusion    Motor:  Muscle bulk: poor, tone normal, pronator drift none tremor none Mvmt Root Nerve  Muscle Right Left Comments  SA C5/6 Ax Deltoid 4+ 4+   EF C5/6 Mc Biceps 5 5   EE C6/7/8 Rad Triceps 5 5   WF C6/7 Med FCR     WE C7/8 PIN ECU     F Ab C8/T1 U ADM/FDI 5 5   HF L1/2/3 Fem Illopsoas 2 2 Declined to let me evaluated her legs 2/2 pain.  KE L2/3/4 Fem Quad     DF L4/5 D Peron Tib Ant     PF S1/2 Tibial Grc/Sol      Reflexes:  Right Left Comments  Pectoralis      Biceps (C5/6) 2 2   Brachioradialis (C5/6) 2 2    Triceps (C6/7) 2 2    Patellar (L3/4)   Declined reflexes in legs due to pain   Achilles (S1)      Hoffman      Plantar     Jaw jerk    Sensation:  Light touch Mildly decreased sensation to touch in BL legs.   Pin prick    Temperature    Vibration   Proprioception    Coordination/Complex Motor:  - Finger to Nose intact BL - Heel to shin unable to assess, patient declined - Rapid alternating movement are slowed throughout - Gait: deferred.  Labs   CBC:  Recent Labs  Lab 05/19/21 0820 05/22/21 1552  WBC 4.7 4.1  NEUTROABS 3.2  --   HGB 12.0 11.2*  HCT 38.1 35.6*  MCV 102.7* 103.2*  PLT 108* 148*    Basic Metabolic Panel:  Lab Results   Component Value Date   NA 142 05/23/2021   K 3.9 05/23/2021   CO2 28 05/23/2021   GLUCOSE 155 (H) 05/23/2021   BUN 74 (H) 05/23/2021   CREATININE 5.29 (H) 05/23/2021   CALCIUM 10.4 (H) 05/23/2021   GFRNONAA 8 (L) 05/23/2021   Lipid Panel: No results found for: LDLCALC HgbA1c: No results found for: HGBA1C Urine Drug Screen: No results found for: LABOPIA, COCAINSCRNUR, LABBENZ, AMPHETMU, THCU, LABBARB  Alcohol Level No results found for: [place]  CT Head without contrast(personally reviewed): CTH was negative for a large hypodensity concerning for a large territory infarct or hyperdensity concerning for an ICH  MR Angio head without contrast and Carotid Duplex BL: pending  MRI Brain(personally reviewed): 2 puncate cortical infarcts in L MCA territory Impression   Shaylinn Hladik is a 70 y.o. female with PMH significant for  metastatic renal cell cancer s/p partial nephrectomy with recurrence and mets to liver, hx of endometrial cancer s/p hysterectomy, hx of DVT on eliquis, GERD, prior stroke in 2017 who is admitted with confusion and AMS x 2 weeks. Found to have a UTI and being treated with  Ceftriaxone. MRI brain demonstrated 2 incidental punctate strokes in L MCA territory. I do not think strokes explain her encephalopathy/confusion.  Primary Diagnosis:  Cerebral infarction, unspecified.   Recommendations   Plan:  - Frequent Neuro checks per stroke unit protocol - Recommend Vascular imaging with MRA Angio Head without contrast and US Carotid doppler - Recent TTE on 03/19/21 with EF of 55-60%, no mention of shunt on the report. - Recommend obtaining Lipid panel with LDL - Please start statin if LDL > 70 - Recommend HbA1c - continue Eliquis, no need for antiplatelet at this time. - SBP goal - permissive hypertension first 24 h < 220/110. Held home meds.  - Recommend Telemetry monitoring for arrythmia - Recommend bedside swallow screen prior to PO intake. - Stroke education  booklet - Recommend PT/OT/SLP consult - Stroke team to follow.  __________________________________________________________________  Plan discussed with Dr. Johnell Comings with the hospitalist team overnight  Thank you for the opportunity to take part in the care of this patient. If you have any further questions, please contact the neurology consultation attending.  Signed,  [place] Pager Number [phone] _ _ _   _ __   _ __ _ _  __ __   _ __   __ _

## 2021-05-24 NOTE — Progress Notes (Signed)
Dr. Vicente Serene called and read MRI and  M.D. stated "That patient had 2 small strokes unsure when." M.D. aware that patient is in Dialysis and was Alert and orin. And taking fluids well and moved all extrem.when she went to Dialysis. Tim R.N. aware and Neligh R.N. with rapid response also aware. Attempted x 2 to call R.N. In Dialysis to let him know but no answer.

## 2021-05-24 NOTE — Progress Notes (Signed)
[facility] Progress Note    Assessment/ Plan:    AMS: Severe change at home over the past few weeks, cannot perform ADLs. Ammonia ok. Urinalysis c/w UTI - on abx. MRI brain pending to assure no cancer mets or other acute findings that head CT did not show. Palliative care involved - greatly appreciate their assistance, she will now be DNR but plan is to continue dialysis. UTI:  on rocephin  ESRD:  Continue HD per TTS schedule  Hypertension/volume: BP controlled, but with mild dyspnea and edema - UF as tolerated.  Anemia: Hgb 11.2. No ESA d/t ongoing cancer.  Metabolic bone disease: CorrCa high, Phos ok. Last PTH > 1000. No VDRA, use 2Ca bath with HD and add PO sensipar.  Nutrition:  Alb low, adding supplements  Stage 4 RCC with liver mets  Hx DVT: On Eliquis  Subjective:   Still with altered mentation, ripped IV out, ct scan delayed, she is requesting something for anxiety. Did well with hd yesterday, net uf 1007cc   Objective:   BP 96/64 (BP Location: Right Arm)   Pulse 83   Temp 97.6 F (36.4 C) (Oral)   Resp 20   Ht 5\' 4"  (1.626 m)   Wt 84 kg   SpO2 97%   BMI 31.79 kg/m   Intake/Output Summary (Last 24 hours) at 05/24/2021 0908 Last data filed at 05/24/2021 0031 Gross per 24 hour  Intake 820 ml  Output 1007 ml  Net -187 ml   Weight change: 0 kg  Physical Exam: KDT:OIZTIWPY CVS:rrr Resp:normal wob Abd:nd Ext:1+ edema Neuro: awake, alert, agitated, moving all ext spontaneously HD access: LUE AVF  Imaging: DG Thoracic Spine 2 View  Result Date: 05/22/2021 CLINICAL DATA:  Fall. EXAM: THORACIC SPINE 2 VIEWS COMPARISON:  Reformats from chest CT 3 days ago. FINDINGS: Twelve thoracic type vertebra. No evidence of fracture. Mild diffuse degenerative disc disease. Vertebral body heights are normal. Cardiomegaly which is similar to recent CT. No paravertebral soft tissue abnormality. IMPRESSION: Mild diffuse degenerative disc disease without acute fracture.  Electronically Signed   By: Keith Rake M.D.   On: 05/22/2021 19:41   DG Lumbar Spine Complete  Result Date: 05/22/2021 CLINICAL DATA:  Post fall. EXAM: LUMBAR SPINE - COMPLETE 4+ VIEW COMPARISON:  Reformats from abdominopelvic CT 3 days ago 05/19/2021 FINDINGS: Bones are subjectively under mineralized. No acute fracture. Vertebral body heights are normal. Multilevel degenerative disc disease. Multilevel facet hypertrophy. Degenerative change of the sacroiliac joints which remain congruent. Vascular calcifications are seen. IMPRESSION: 1. No acute fracture. 2. Multilevel degenerative disc disease and facet hypertrophy. Electronically Signed   By: Keith Rake M.D.   On: 05/22/2021 20:17   CT HEAD WO CONTRAST (5MM)  Result Date: 05/22/2021 CLINICAL DATA:  Mental status change, unknown cause EXAM: CT HEAD WITHOUT CONTRAST TECHNIQUE: Contiguous axial images were obtained from the base of the skull through the vertex without intravenous contrast. COMPARISON:  Head CT 05/19/2021 FINDINGS: Brain: No evidence of acute intracranial hemorrhage or extra-axial collection.No evidence of mass lesion/concern mass effect.The ventricles are unchanged in size.Confluent periventricular and subcortical white matter hypoattenuation, which is nonspecific but likely sequela of chronic small vessel ischemic disease.Mild cerebral atrophy. Unchanged encephalomalacia in the right MCA territory from prior infarct. Vascular: No hyperdense vessel or unexpected calcification. Skull: Normal. Negative for fracture or focal lesion. Sinuses/Orbits: The paranasal sinuses are predominantly clear. Other: None. IMPRESSION: No acute intracranial abnormality. Unchanged sequela of chronic small vessel ischemic disease and cerebral atrophy.  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 Electronically Signed   By: Maurine Simmering M.D.   On: 05/22/2021 19:58   MR BRAIN W WO CONTRAST  Result Date: 05/23/2021 CLINICAL DATA:  Delirium EXAM: MRI HEAD  WITHOUT AND WITH CONTRAST TECHNIQUE: Multiplanar, multiecho pulse sequences of the brain and surrounding structures were obtained without and with intravenous contrast. CONTRAST:  8.76mL GADAVIST GADOBUTROL 1 MMOL/ML IV SOLN COMPARISON:  None. FINDINGS: Brain: There are 2 punctate foci of abnormal diffusion restriction within the left MCA territory. No other diffusion abnormality. No acute or chronic hemorrhage. There is multifocal hyperintense T2-weighted signal within the white matter. Generalized volume loss without a clear lobar predilection. The midline structures are normal. Vascular: Major flow voids are preserved. Skull and upper cervical spine: Normal calvarium and skull base. Visualized upper cervical spine and soft tissues are normal. Sinuses/Orbits:No paranasal sinus fluid levels or advanced mucosal thickening. No mastoid or middle ear effusion. Normal orbits. IMPRESSION: 1. Two punctate foci of acute ischemia within the left MCA territory. No hemorrhage or mass effect. 2. Findings of chronic microvascular ischemia and generalized volume loss. Electronically Signed   By: Ulyses Jarred M.D.   On: 05/23/2021 19:38   DG Chest Port 1 View  Result Date: 05/24/2021 CLINICAL DATA:  Shortness of breath EXAM: PORTABLE CHEST 1 VIEW COMPARISON:  Chest CT 05/19/2021 FINDINGS: Cardiomegaly with congested appearance of vessels. No visible effusion or pneumothorax. Mitral annular calcification. Artifact from EKG leads. IMPRESSION: Cardiomegaly and vascular congestion. Electronically Signed   By: Monte Fantasia M.D.   On: 05/24/2021 04:08    Labs: BMET Recent Labs  Lab 05/19/21 0820 05/22/21 1552 05/23/21 0111  NA 141 144 142  K 4.0 4.0 3.9  CL 98 98 99  CO2 26 30 28   GLUCOSE 122* 117* 155*  BUN 65* 68* 74*  CREATININE 5.03* 5.19* 5.29*  CALCIUM 10.4* 10.3 10.4*  PHOS  --   --  5.0*   CBC Recent Labs  Lab 05/19/21 0820 05/22/21 1552  WBC 4.7 4.1  NEUTROABS 3.2  --   HGB 12.0 11.2*  HCT 38.1  35.6*  MCV 102.7* 103.2*  PLT 108* 148*    Medications:     Chlorhexidine Gluconate Cloth  6 each Topical Q0600   pantoprazole (PROTONIX) IV  40 mg Intravenous Q24H      Gean Quint, MD [facility] 05/24/2021, 9:08 AM

## 2021-05-24 NOTE — Progress Notes (Signed)
Chaplain returned when family was present to confirm request for [place] priest. Pt lives at home with two sons, the younger of whom Legrand Como) is adopted.  They just moved back to the sons' childhood home of [place] (pt was born and grew up in [place]) after living in [place] for many years.  Family appears very close; sons are having a very difficult time with new status. Pt confirmed she would like a priest but also welcomed a "chaplain prayer".  Older son [name]) left room during prayer, sharing later that he is very angry at God, feels like his mother has gotten one raw deal after another. Son does acknowledge struggling with anger, and he also states that this is not his mother; he feels as if he had already lost her.    Provided emotional and spiritual support to pt and family bedside and additionally to adult sons in hall. Offered hospitality and explained availability of chaplains.  Idelle Crouch was on hall and  was willing to go in to see pt, and to provide prayer and communion.     Please contact as needed or requested.  Minus Liberty, Chaplain Pager: [phone]    05/24/21 1500  Clinical Encounter Type  Visited With Patient and family together  Visit Type Initial;Spiritual support  Consult/Referral To Chaplain  Stress Factors  Patient Stress Factors Loss of control;Health changes  Family Stress Factors Exhausted;Family relationships;Loss of control

## 2021-05-24 NOTE — Progress Notes (Signed)
Palliative Medicine Inpatient Follow Up Note  Reason for consult:  Son requested, outpatient palliative meeting planned for 05/29/2021   HPI:  Per intake H&P by Sonya Mango, MS on 05/22/2021 --> "Sonya Beck is a 70 y.o. 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 They presented from home via EMS to the ED on 05/22/2021. Patient was unable to provide a reliable history so spoke with son Sonya Beck over the phone. He states that she has had a generalized state of confusion since 8/15 after her appointment with her oncologist. Her personality has drastically changed. She often "throws tantrums" and onto the floor. She has not had any additional medications in that time period. He was advised by [facility] to call EMS when she throws herself to the floor and he's not able to get her back up again so that is why he called EMS today. He would like for her to have a psych evaluation. He states that at baseline, she is able to ambulate with a rolling walker but only when she wants to. Currently, patient thinks that she is in an ambulance and that she called EMS for "several big issues". She does not complain of any pain or current concerns. She states that she does still make urine. Denies any medications other than eliquis and her son corroborates that information. Patient was admitted to medicine service for further workup and management of AMS as outlined in detail below.  Today's Discussion (05/24/2021):  *Please note that this is a verbal dictation therefore any spelling or grammatical errors are due to the "[employer]" system interpretation.  Chart reviewed.  Per conversation via secure chat with Dr. [place] Karinda is not able to tolerate HD, has an acute CVA, and metastatic disease with a poor prognosis overall.  [place] is not thriving presently and appears to be acutely on the decline. Dr. [place] plans to speak with patients son, Sonya Beck this afternoon about goals. The Palliative care team is aware and will be present to continue these difficult conversations. ____________________________________________ Addendum:  Family Present: Joette Catching  Providers Present: Dr. [place] & Tacey Ruiz, NP  Dr. [place] and myself met with Kmari's son, Sonya Beck at bedside. Dr. [place] provided a brief review of Sonya Beck's active medical illness(s). He shared the concern of Sonya Beck's recent stroke, metastatic disease, and kidney dz not presently tolerant of HD. We discussed that Sonya Beck's body appears to be declaring an inability to continue aggressive measures. Sonya Beck was tearful. Dr. [place] shared that although we can do many things in medicine, sometimes these things can prolong suffering which we do not want to do for [place].  Sonya Beck asked from a palliative perspective what the next steps are. I shared that if our goals are to alleviate suffering then changing our focus to a comfort oriented approach is a reasonable consideration. We talked about transition to comfort measures in house and what that would entail inclusive of medications to control pain, dyspnea, agitation, nausea, itching, and hiccups.   We discussed stopping all uneccessary measures such as blood draws, needle sticks, cardiac monitoring, IV gtts, IV abx, hemodialysis, and frequent vital signs. Provided empathic listening as a form of support.  Sonya Beck has elected to pursue keeping his mother comfortable. We reviewed what home and inpatient hospice's look like. Sonya Beck understands that he cannot provide  24/7 care for Aisa and would like to pursue inpatient hospice through [facility]. We discussed how stopping dialysis often qualifies patients like [place] for this level of care.    Patients RN, Anderson Malta updated on the change  of plan.  TOC messaged as was the hospice liaison after our meeting.  Objective Assessment: Vital Signs Vitals:   05/24/21 1000 05/24/21 1119  BP:  122/90  Pulse:  81  Resp:  (!) 36  Temp: 98.6 F (37 C) (!) 97.5 F (36.4 C)  SpO2:      Intake/Output Summary (Last 24 hours) at 05/24/2021 1206 Last data filed at 05/24/2021 0031 Gross per 24 hour  Intake 580 ml  Output 1007 ml  Net -427 ml   Last Weight  Most recent update: 05/24/2021  5:27 AM    Weight  84 kg (185 lb 3 oz)            Gen:  Elderly F in moderate distress HEENT: Dry mucous membranes CV: Regular rate and rhythm  PULM: On .5LPM [place] ABD: soft/nontender  EXT: No edema  Neuro: Somnolent  SUMMARY OF RECOMMENDATIONS   DNAR/DNI  Comfort Care  Unrestricted visitation  Appreciate chaplain helping to coordinate a catholic priest  Comfort medications per [facility]  [facility] consults for [facility]  Ongoing support  Time Spent: 32 Greater than 50% of the time was spent in counseling and coordination of care ______________________________________________________________________________________ [place] Team Team Cell Phone: [phone] Please utilize secure chat with additional questions, if there is no response within 30 minutes please call the above phone number  Palliative Medicine Team providers are available by phone from 7am to 7pm daily and can be reached through the team cell phone.  Should this patient require assistance outside of these hours, please call the patient's attending physician.

## 2021-05-24 NOTE — Social Work (Signed)
CSW acknowledged a consult for residential hospice. CSW spoke with pt's son [name] and he confirmed that he wanted [employer].  CSW aleter Anderson Malta with [facility] to make the [employer].

## 2021-05-24 NOTE — Progress Notes (Signed)
Manufacturing engineer [facility])  Referral received for EOL care at [facility].  Unfortunately, we do not have any beds today.  Patient will need to be approved by [facility] MD prior to being able to offer a bed. [facility] will reach out to hospital and family once this has been completed.  Thank you, Venia Carbon RN, BSN, [facility] Liaison

## 2021-05-24 NOTE — Progress Notes (Addendum)
ANTICOAGULATION CONSULT NOTE - Follow Up Consult  Pharmacy Consult for argatroban Indication:  h/o DVT  Allergies  Allergen Reactions   Heparin Other (See Comments)    HITT   Penicillins Swelling    Swelling in mouth     Patient Measurements: Height: 5\' 4"  (162.6 cm) Weight: 84 kg (185 lb 3 oz) IBW/kg (Calculated) : 54.7  Vital Signs: Temp: 97.6 F (36.4 C) (09/04 0855) Temp Source: Oral (09/04 0855) BP: 96/64 (09/04 0855) Pulse Rate: 83 (09/04 0855)  Labs: Recent Labs    05/22/21 1552 05/22/21 1947 05/23/21 0111 05/24/21 0714  HGB 11.2*  --   --   --   HCT 35.6*  --   --   --   PLT 148*  --   --   --   APTT  --   --   --  37*  LABPROT  --  14.8  --   --   INR  --  1.2  --   --   CREATININE 5.19*  --  5.29*  --     Estimated Creatinine Clearance: 10.1 mL/min (A) (by C-G formula based on SCr of 5.29 mg/dL (H)).   Medications:  Infusions:   argatroban 0.75 mcg/kg/min (05/24/21 0442)   cefTRIAXone (ROCEPHIN)  IV 1 g (05/24/21 0900)    Assessment: 70 y.o. F presents with AMS. 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   Pt was on apixaban 5mg  3x/week for history of VTE - taking only on HD days (this strange dosing was verified with pt's son twice).   Apixaban 5mg  po BID was ordered upon admission to hospital - last dose 9/2 0900. Apixaban dose changed to 2.5mg  po BID (age and renal function) but pt unable to receive 9/3 pm dose due to failing swallow study.   Since pt with acute stroke this admission, will aim for lower aPTT goal range 50-70 sec. Neuro aware of full-dose anticoagulation.  aPTT is subtherapeutic 37 with lower goal range on argatroban infusion 0.75 mcg/kg/min. Last CBC 9/2 stable. No s/sx of bleeding noted, no  infusion problems per RN.  Goal of Therapy:  aPTT 50-70 seconds Monitor platelets by anticoagulation protocol: Yes   Plan:  Increase agratroban infusion to 0.95 mcg/kg/min (~30% increase) 4 hour aPTT CBC, apTT daily Monitor s/sx bleeding F/u transition to eliquis  Laurey Arrow, PharmD PGY1 Pharmacy Resident 05/24/2021  9:40 AM  Please check [email or web address] for unit-specific pharmacy phone numbers.

## 2021-05-24 NOTE — Progress Notes (Signed)
RT obtained ABG on pt with the following results on [place] 3 LPM. RT will continue to monitor.  Results for Sonya Beck, Sonya Beck (MRN [id number]) as of 05/24/2021 04:23  Ref. Range 05/24/2021 04:03  Sample type Unknown ARTERIAL  FIO2 Unknown 32.00  pH, Arterial Latest Ref Range: 7.350 - 7.450  7.521 (H)  pCO2 arterial Latest Ref Range: 32.0 - 48.0 mmHg 31.2 (L)  pO2, Arterial Latest Ref Range: 83.0 - 108.0 mmHg 129 (H)  Acid-Base Excess Latest Ref Range: 0.0 - 2.0 mmol/L 2.6 (H)  Bicarbonate Latest Ref Range: 20.0 - 28.0 mmol/L 25.5  O2 Saturation Latest Units: % 99.4  Patient temperature Unknown 36.4  Collection site Unknown RIGHT BRACHIAL  Allens test (pass/fail) Latest Ref Range: PASS  YES (A)

## 2021-05-24 NOTE — Evaluation (Signed)
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 female with PMH significant for metastatic renal cell cancer s/p partial nephrectomy with recurrence and mets to liver, hx of endometrial cancer s/p hysterectomy, hx of DVT on eliquis, GERD, prior stroke in 2017 who is admitted with confusion and AMS x 2 weeks. Workup with UA consistent wit ha UTI and being treated with Ceftriaxone. MRI Brain was obtained as part of workup and specifically to evaluate for brain mets and demonstrated 2 punctate strokes in L MCA territory. Pt reportedly lives at home with her son with intermittent supervision.   Assessment / Plan / Recommendation Clinical Impression  Pt at times inattetive and restless,  but able to follow commands for swallow eval. Pt would not tolerate fully upright position even with reasoning and assist. No signs of dysphagia though the tongue was slightly deviated left (seemed to be some tissue changes on the left lateral aspect on tongue, pt reports previous sores from chemo pills). Able to masticate and swallow thins without signs of aspiration, reports she is a picky eater. Will initiate a regular diet and thin liquids  and sign off for swallowing. SLP Visit Diagnosis: Dysphagia, unspecified (R13.10)    Aspiration Risk  Mild aspiration risk    Diet Recommendation Regular;Thin liquid   Liquid Administration via: Cup;Straw Medication Administration: Whole meds with liquid Supervision: Staff to assist with self feeding Compensations: Slow rate;Small sips/bites;Minimize environmental distractions Postural Changes: Seated upright at 90 degrees    Other  Recommendations Oral Care Recommendations: Oral care BID   Follow up Recommendations None      Frequency and Duration            Prognosis        Swallow Study   General HPI: Sonya Beck is a 70 y.o. female with PMH significant for metastatic renal cell cancer s/p partial nephrectomy with recurrence and mets to liver, hx of endometrial cancer s/p hysterectomy, hx of DVT on eliquis, GERD, prior stroke in 2017 who is admitted with confusion and AMS x 2 weeks. Workup with UA consistent wit ha UTI and being treated with Ceftriaxone. MRI Brain was obtained as part of workup and specifically to evaluate for brain mets and demonstrated 2 punctate strokes in L MCA territory. Pt reportedly lives at home with her son with intermittent supervision. Type of Study: Bedside Swallow Evaluation Previous Swallow Assessment: none Diet Prior to this Study: NPO Temperature Spikes Noted: No Respiratory Status: Nasal cannula History of Recent Intubation: No Behavior/Cognition: Distractible;Requires cueing Oral Cavity Assessment: Within Functional Limits Oral Care Completed by SLP: No Oral Cavity - Dentition: Adequate natural dentition Vision: Functional for self-feeding Self-Feeding Abilities: Total assist Patient Positioning: Other (comment) (  elevated sidelying) Baseline Vocal Quality: Normal Volitional Cough: Cognitively unable to elicit Volitional Swallow: Able to elicit    Oral/Motor/Sensory Function Overall Oral Motor/Sensory Function: Mild  impairment Facial ROM: Within Functional Limits Facial Symmetry: Within Functional Limits Facial Strength: Within Functional Limits Facial Sensation: Within Functional Limits Lingual ROM: Within Functional Limits Lingual Symmetry: Abnormal symmetry left Lingual Strength: Within Functional Limits Lingual Sensation: Within Functional Limits Velum: Within Functional Limits Mandible: Within Functional Limits   Ice Chips Ice chips: Not tested   Thin Liquid Thin Liquid: Within functional limits    Nectar Thick Nectar Thick Liquid: Not tested   Honey Thick Honey Thick Liquid: Not tested   Puree Puree: Within functional limits Presentation: Spoon   Solid     Solid: Within functional limits      Angles Trevizo, Katherene Ponto 05/24/2021,9:59 AM

## 2021-05-24 NOTE — Progress Notes (Signed)
ANTICOAGULATION CONSULT NOTE - Initial Consult  Pharmacy Consult for Argatroban Indication:  h/o DVT  Allergies  Allergen Reactions   Heparin Other (See Comments)    HITT   Penicillins Swelling    Swelling in mouth     Patient Measurements: Height: 5\' 4"  (162.6 cm) Weight: 84 kg (185 lb 3 oz) IBW/kg (Calculated) : 54.7  Vital Signs: Temp: 97.6 F (36.4 C) (09/04 0319) Temp Source: Oral (09/04 0319) BP: 91/54 (09/04 0319) Pulse Rate: 86 (09/04 0319)  Labs: Recent Labs    05/22/21 1552 05/22/21 1947 05/23/21 0111  HGB 11.2*  --   --   HCT 35.6*  --   --   PLT 148*  --   --   LABPROT  --  14.8  --   INR  --  1.2  --   CREATININE 5.19*  --  5.29*    Estimated Creatinine Clearance: 10.1 mL/min (A) (by C-G formula based on SCr of 5.29 mg/dL (H)).   Medical History: Past Medical History:  Diagnosis Date   Cancer 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 (Patient taking differently: Take 5 mg by mouth See admin instructions. Given at dialysis on Tuesday,Thursday,saturday) 60 tablet 3 05/21/2021 at 1000   iron sucrose in sodium chloride 0.9 % 100 mL Iron Sucrose (Venofer)      Nutritional Supplements (ENSURE ORIGINAL PO) Take 237 mLs by mouth 4 (four) times daily.   05/22/2021   ondansetron (ZOFRAN) 4 MG tablet Take 1 tablet (4 mg total) by mouth every 6 (six) hours as needed for nausea. 20 tablet 0 Past Week   pantoprazole (PROTONIX) 40 MG tablet Take 1 tablet (40 mg  total) by mouth daily. (Patient taking differently: Take 40 mg by mouth daily as needed.) 30 tablet 3 Past Week   cabozantinib (CABOMETYX) 40 MG tablet Take 1 tablet (40 mg total) by mouth daily. Take on an empty stomach, 1 hour before or 2 hours after meals. (Patient not taking: Reported on 05/22/2021) 30 tablet 1 Not Taking   promethazine (PHENERGAN) 12.5 MG suppository Place 1 suppository (12.5 mg total) rectally every 6 (six) hours as needed for refractory nausea / vomiting. (Patient not taking: No sig reported) 12 each 0 Not Taking    Assessment: 70 y.o. F presents with AMS. Pt was on apixaban 5mg  3x/week  -taking only on HD days (this strange dosing was verified with pt's son twice). Pt was ordered apixaban 5mg  po BID upon admission to hospital - last dose 9/2 0900. Apixaban dose changed to 2.5mg  po BID (age and renal function) but pt was unable to received 9/3 pm dose due to failing swallow study. 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 To begin argatroban until apixaban can be restarted.  Since pt with acute stroke this admission, will aim for lower aPTT goal range 50-70 sec. Neuro aware of full-dose anticoagulation.  Goal of Therapy:  aPTT 50-70 seconds Monitor platelets by anticoagulation protocol: Yes   Plan:  Argatroban 0.75 mcg/kg/min F/u 4 hr aPTT CBC and aPTT daily  Sherlon Handing, PharmD, BCPS Please see amion for complete clinical pharmacist phone list 05/24/2021,3:27 AM

## 2021-05-24 NOTE — Progress Notes (Signed)
Chaplain attempted pt visit to assess for consult, pt requesting [employer] priest.  Pt was sleeping and did not rouse to soft-medium verbal greetings (X3). Will attempt to contact son and consult with RN about pt needs.    Please page when pt is awake.  Minus Liberty, [place] Pager: [phone]    05/24/21 1100  Clinical Encounter Type  Visited With Patient not available  Visit Type Initial  Referral From Other (Comment) (Consult)  Consult/Referral To [place]  ([place] priest)

## 2021-05-24 NOTE — Progress Notes (Signed)
Patient transitioning to hospice. Will cancel dialysis. Will sign off from a nephrology perspective. Thank you for involving [place] in the care of this patient. Please call with any questions/concerns.  Gean Quint, MD [facility]

## 2021-05-24 NOTE — Progress Notes (Signed)
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  Son reported that since 8/15 after appointment with her oncologist, her personality has drastically change and often throws tantrums and falls onto the floor.  She has not had any additional medications in that time.  And was advised by her home health agencies to call EMS when she throws herself on the floor and unable to get back up.  Per son, at baseline she is able to ambulate with a rolling walker; but apparently only when she wants to.  Patient does not complain of any pain or current concerns.  She does still make urine.  Patient's son requests psychiatric evaluation due to her personality change.  In the ED, her 97.9 F, HR 81, RR 16, BP 96/76, SPO2 1% on room air.  144, potassium 4.0, chloride 98, CO2 30, glucose 117, BUN 68, creatinine 5.19, albumin 3.2, AST 25, ALT 21, total bilirubin 1.1.  WBC 4.1, hemoglobin 11.2, platelets 148.  Lipase 84.  INR 1.2.  Ammonia level level 12.  COVID-19/SARS-CoV-2 negative.  CT head without contrast with no acute intercranial normality, unchanged sequelae of chronic small vessel ischemic disease and cerebral atrophy.  Urinalysis with moderate leukocytes, negative nitrite, many bacteria, greater than 50 WBCs.  Duration consulted for further evaluation and management of acute metabolic encephalopathy concerning for UTI versus worsening metastatic disease vs developing dementia.   Assessment & Plan:   Principal Problem:   AMS (altered mental status) Active Problems:   History of DVT of lower extremity   Metastatic renal cell carcinoma to liver (HCC)   ESRD  on hemodialysis (HCC)   Weakness   Pressure injury of skin   Urinary tract infection   Acute metabolic encephalopathy   Cerebral thrombosis with cerebral infarction   Acute metabolic encephalopathy, POA Patient presenting to the ED from home with progressive confusion and mental status changes.  Patient is afebrile without leukocytosis.  Urinalysis consistent with UTI as she still makes urine although on HD.  Also with noted acute CVA on MRI with also likely contributing factor of developing dementia with cerebral atrophy noted on imaging.  We will continue treatment as below for UTI.  Acute CVA Brain with and without contrast 9/3 with 2 punctate foci of acute ischemia within the left MCA territory without hemorrhage/mass-effect; Chronic microvascular ischemia and generalized volume loss.  TTE 03/19/21 with LVEF 55-60%, no LV R WMA, moderate LVH, RV systolic function mildly reduced, elevated PASP, LA mildly dilated, RA mildly dilated, IVC dilated, small pericardial effusion without evidence of tamponade physiology, no intra-atrial shunt identified.  LDL 65 --Neurology following, appreciate assistance --CT angio head/neck: Pending --Hemoglobin A1c: pending --SLP currently recommending SNF and regular diet with thin liquids --Continue monitor on telemetry  Dyspnea After hemodialysis earlier this morning, patient with increased tachypnea.  Chest x-ray overnight with cardiomegaly and vascular congestion.  ABG with pH 7.52, PaCO2 31.2, PO2 129. UF with HD 1L last night. --Check CT angiogram chest PE --DuoNebs as needed --Supplemental oxygen, maintain SPO2 greater than 92%; currently on 0.5 L nasal cannula --Volume management with HD  Urinary tract infection Urinalysis with moderate leukocytes, negative  nitrite, many bacteria and greater than 50 WBCs.  Patient is afebrile without leukocytosis.  Could be a contributing factor to her confusion. --Blood cultures x2: no growth <24h --Urine culture:  Pending --Ceftriaxone 1 g IV q24h  History of metastatic renal cell carcinoma with hepatic involvement Patient follows with medical oncology, Dr. Alen Blew outpatient.  Originally diagnosed February 2022.  Patient previously received ipilimumab and nivolumab 1 cycle in June 2022 while living in [place] which was discontinued due to poor tolerance.  Most recently started on Cabometyx July 2022 with therapy currently on hold due to poor toleration with GI toxicity including nausea/vomiting.  MR brain with no metastatic lesions appreciated.   --Palliative care consulted and following, now DNR --Given her metastatic process and poor toleration with chemotherapy agents x2, overall prognosis is poor and may need to consider transitioning to hospice  ESRD on HD TTS Follows with nephrology outpatient, dialyzes at [place] [facility]. --Nephrology following for continued HD while inpatient  Hx DVT --Eliquis discontinued overnight in favor of argatroban due to failed swallow evaluation  Buttocks, stage I pressure injury of skin, POA Pressure Injury 05/23/21 Buttocks Mid Stage 1 -  Intact skin with non-blanchable redness of a localized area usually over a bony prominence. (Active)  05/23/21 0200  Location: Buttocks  Location Orientation: Mid  Staging: Stage 1 -  Intact skin with non-blanchable redness of a localized area usually over a bony prominence.  Wound Description (Comments):   Present on Admission: Yes  --Wound care consult, continue local wound care, offloading  GERD: Continue Protonix 40 mg p.o. daily  Weakness/debility/deconditioning: --PT/OT recommends SNF versus home with 24-hour care; but if no significant provement may be more amenable to a residential hospice situation   DVT prophylaxis:   Argatroban drip    Code Status: DNR Family Communication: Updated patient's son Alvie Heidelberg via telephone this morning  Disposition Plan:  Level of care: Med-Surg Status is: Inpatient  Remains  inpatient appropriate because:Hemodynamically unstable, Altered mental status, Ongoing diagnostic testing needed not appropriate for outpatient work up, Unsafe d/c plan, IV treatments appropriate due to intensity of illness or inability to take PO, and Inpatient level of care appropriate due to severity of illness  Dispo:  Patient From: Home  Planned Disposition: Home with Health Care Svc  Medically stable for discharge: No       Consultants:  Nephrology, Dr. Candiss Norse Palliative care Neurology   Procedures:  None  Antimicrobials:  Ceftriaxone 9/3>>   Subjective: Patient seen examined at bedside, head.  Anxious with dyspnea.  Continues to be confused.  Overnight MRI positive for acute CVA.  Nursing concerned about increased work of breathing this morning.  Awaiting CT angiogram head/neck.  Currently on 0.5 L nasal cannula.  Patient appears to be uncomfortable.  Underwent hemodialysis yesterday w/ 1L UF.  No family present at bedside this morning.  Patient denies headache, no fever/chills, no abdominal pain, no chest pain, no palpitations.  No other concerns per nursing overnight or this morning other than continued confusion and dyspnea.  Patient's son [name] updated on progressive decline and encouraged him to come to the hospital today.  He states will be in shortly.  Objective: Vitals:   05/24/21 0540 05/24/21 0815 05/24/21 0855 05/24/21 1000  BP: (!) 89/53 (!) 75/63 96/64   Pulse: 82 81 83   Resp: (!) 24 (!) 35 20   Temp: 98.7 F (37.1 C) (!) 96.6 F (35.9 C) 97.6 F (36.4 C) 98.6 F (37 C)  TempSrc:  Oral Axillary Oral   SpO2: 98%  97%   Weight:      Height:        Intake/Output Summary (Last 24 hours) at 05/24/2021 1044 Last data filed at 05/24/2021 0031 Gross per 24 hour  Intake 580 ml  Output 1007 ml  Net -427 ml   Filed Weights   05/22/21 1546 05/24/21 0527  Weight: 84 kg 84 kg    Examination:  General exam: Dyspneic, slightly increased work of breathing,  chronically ill in appearance Respiratory system: Clear to auscultation.  Slightly increased respiratory effort, on 0.5 L nasal cannula with SPO2 96% at rest Cardiovascular system: S1 & S2 heard, RRR. No JVD, murmurs, rubs, gallops or clicks. No pedal edema. Gastrointestinal system: Abdomen is nondistended, soft and nontender. No organomegaly or masses felt. Normal bowel sounds heard. Central nervous system: Alert and oriented to person/place/time. No focal neurological deficits. Extremities: Symmetric 5 x 5 power. Skin: No rashes, lesions or ulcers Psychiatry: Judgement and insight appear poor. Mood & affect appropriate.     Data Reviewed: I have personally reviewed following labs and imaging studies  CBC: Recent Labs  Lab 05/19/21 0820 05/22/21 1552  WBC 4.7 4.1  NEUTROABS 3.2  --   HGB 12.0 11.2*  HCT 38.1 35.6*  MCV 102.7* 103.2*  PLT 108* 539*   Basic Metabolic Panel: Recent Labs  Lab 05/19/21 0820 05/22/21 1552 05/23/21 0111  NA 141 144 142  K 4.0 4.0 3.9  CL 98 98 99  CO2 26 30 28   GLUCOSE 122* 117* 155*  BUN 65* 68* 74*  CREATININE 5.03* 5.19* 5.29*  CALCIUM 10.4* 10.3 10.4*  PHOS  --   --  5.0*   GFR: Estimated Creatinine Clearance: 10.1 mL/min (A) (by C-G formula based on SCr of 5.29 mg/dL (H)). Liver Function Tests: Recent Labs  Lab 05/19/21 0820 05/22/21 1552 05/23/21 0111  AST 26 25  --   ALT 23 21  --   ALKPHOS 79 80  --   BILITOT 1.6* 1.1  --   PROT 7.2 6.8  --   ALBUMIN 3.4* 3.2* 3.1*   Recent Labs  Lab 05/22/21 1947  LIPASE 84*   Recent Labs  Lab 05/22/21 1947  AMMONIA 12   Coagulation Profile: Recent Labs  Lab 05/19/21 0820 05/22/21 1947  INR 1.2 1.2   Cardiac Enzymes: No results for input(s): CKTOTAL, CKMB, CKMBINDEX, TROPONINI in the last 168 hours. BNP (last 3 results) No results for input(s): PROBNP in the last 8760 hours. HbA1C: No results for input(s): HGBA1C in the last 72 hours. CBG: Recent Labs  Lab  05/22/21 2217  GLUCAP 103*   Lipid Profile: Recent Labs    05/24/21 0714  CHOL 112  HDL 27*  LDLCALC 65  TRIG 102  CHOLHDL 4.1   Thyroid Function Tests: No results for input(s): TSH, T4TOTAL, FREET4, T3FREE, THYROIDAB in the last 72 hours. Anemia Panel: No results for input(s): VITAMINB12, FOLATE, FERRITIN, TIBC, IRON, RETICCTPCT in the last 72 hours. Sepsis Labs: No results for input(s): PROCALCITON, LATICACIDVEN in the last 168 hours.  Recent Results (from the past 240 hour(s))  SARS CORONAVIRUS 2 (TAT 6-24 HRS) Nasopharyngeal Nasopharyngeal Swab     Status: None   Collection Time: 05/23/21  1:08 AM   Specimen: Nasopharyngeal Swab  Result Value Ref Range Status   SARS Coronavirus 2 NEGATIVE NEGATIVE Final    Comment: (NOTE) SARS-CoV-2 target nucleic acids are NOT DETECTED.  The SARS-CoV-2 RNA is generally detectable  in upper and lower respiratory specimens during the acute phase of infection. Negative results do not preclude SARS-CoV-2 infection, do not rule out co-infections with other pathogens, and should not be used as the sole basis for treatment or other patient management decisions. Negative results must be combined with clinical observations, patient history, and epidemiological information. The expected result is Negative.  Fact Sheet for Patients: SugarRoll.be  Fact Sheet for Healthcare Providers: https://www.woods-mathews.com/  This test is not yet approved or cleared by the [place] FDA and  has been authorized for detection and/or diagnosis of SARS-CoV-2 by FDA under an Emergency Use Authorization (EUA). This EUA will remain  in effect (meaning this test can be used) for the duration of the COVID-19 declaration under Se ction 564(b)(1) of the Act, 21 U.S.C. section 360bbb-3(b)(1), unless the authorization is terminated or revoked sooner.  Performed at [facility], [place] [address], [place],  [place] [address]   Culture, blood (routine x 2)     Status: None (Preliminary result)   Collection Time: 05/23/21  9:05 AM   Specimen: BLOOD  Result Value Ref Range Status   Specimen Description BLOOD RIGHT ANTECUBITAL  Final   Special Requests AEROBIC BOTTLE ONLY Blood Culture adequate volume  Final   Culture   Final    NO GROWTH < 24 HOURS Performed at [facility], [place] [address], [place], [place] [address]    Report Status PENDING  Incomplete  Culture, blood (routine x 2)     Status: None (Preliminary result)   Collection Time: 05/23/21  9:05 AM   Specimen: BLOOD  Result Value Ref Range Status   Specimen Description BLOOD BLOOD RIGHT HAND  Final   Special Requests AEROBIC BOTTLE ONLY Blood Culture adequate volume  Final   Culture   Final    NO GROWTH < 24 HOURS Performed at [facility], [place] [address] [place] [address] [place], [place] [address]    Report Status PENDING  Incomplete  MRSA Next Gen by PCR, Nasal     Status: None   Collection Time: 05/24/21  2:05 AM   Specimen: Nasal Mucosa; Nasal Swab  Result Value Ref Range Status   MRSA by PCR Next Gen NOT DETECTED NOT DETECTED Final    Comment: (NOTE) The GeneXpert MRSA Assay (FDA approved for NASAL specimens only), is one component of a comprehensive MRSA colonization surveillance program. It is not intended to diagnose MRSA infection nor to guide or monitor treatment for MRSA infections. Test performance is not FDA approved in patients less than 1 years old. Performed at [facility], [place] [address], [place], [place] [address]          Radiology Studies: DG Thoracic Spine 2 View  Result Date: 05/22/2021 CLINICAL DATA:  Fall. EXAM: THORACIC SPINE 2 VIEWS COMPARISON:  Reformats from chest CT 3 days ago. FINDINGS: Twelve thoracic type vertebra. No evidence of fracture. Mild diffuse degenerative disc disease. Vertebral body heights are normal. Cardiomegaly which is similar to recent CT. No paravertebral soft tissue  abnormality. IMPRESSION: Mild diffuse degenerative disc disease without acute fracture. Electronically Signed   By: Keith Rake M.D.   On: 05/22/2021 19:41   DG Lumbar Spine Complete  Result Date: 05/22/2021 CLINICAL DATA:  Post fall. EXAM: LUMBAR SPINE - COMPLETE 4+ VIEW COMPARISON:  Reformats from abdominopelvic CT 3 days ago 05/19/2021 FINDINGS: Bones are subjectively under mineralized. No acute fracture. Vertebral body heights are normal. Multilevel degenerative disc disease. Multilevel facet  hypertrophy. Degenerative change of the sacroiliac joints which remain congruent. Vascular calcifications are seen. IMPRESSION: 1. No acute fracture. 2. Multilevel degenerative disc disease and facet hypertrophy. Electronically Signed   By: Keith Rake M.D.   On: 05/22/2021 20:17   CT HEAD WO CONTRAST (5MM)  Result Date: 05/22/2021 CLINICAL DATA:  Mental status change, unknown cause EXAM: CT HEAD WITHOUT CONTRAST TECHNIQUE: Contiguous axial images were obtained from the base of the skull through the vertex without intravenous contrast. COMPARISON:  Head CT 05/19/2021 FINDINGS: Brain: No evidence of acute intracranial hemorrhage or extra-axial collection.No evidence of mass lesion/concern mass effect.The ventricles are unchanged in size.Confluent periventricular and subcortical white matter hypoattenuation, which is nonspecific but likely sequela of chronic small vessel ischemic disease.Mild cerebral atrophy. Unchanged encephalomalacia in the right MCA territory from prior infarct. Vascular: No hyperdense vessel or unexpected calcification. Skull: Normal. Negative for fracture or focal lesion. Sinuses/Orbits: The paranasal sinuses are predominantly clear. Other: None. IMPRESSION: No acute intracranial abnormality. Unchanged sequela of chronic small vessel ischemic disease and cerebral atrophy. 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 Electronically Signed   By: Maurine Simmering M.D.   On: 05/22/2021 19:58   MR  BRAIN W WO CONTRAST  Result Date: 05/23/2021 CLINICAL DATA:  Delirium EXAM: MRI HEAD WITHOUT AND WITH CONTRAST TECHNIQUE: Multiplanar, multiecho pulse sequences of the brain and surrounding structures were obtained without and with intravenous contrast. CONTRAST:  8.39mL GADAVIST GADOBUTROL 1 MMOL/ML IV SOLN COMPARISON:  None. FINDINGS: Brain: There are 2 punctate foci of abnormal diffusion restriction within the left MCA territory. No other diffusion abnormality. No acute or chronic hemorrhage. There is multifocal hyperintense T2-weighted signal within the white matter. Generalized volume loss without a clear lobar predilection. The midline structures are normal. Vascular: Major flow voids are preserved. Skull and upper cervical spine: Normal calvarium and skull base. Visualized upper cervical spine and soft tissues are normal. Sinuses/Orbits:No paranasal sinus fluid levels or advanced mucosal thickening. No mastoid or middle ear effusion. Normal orbits. IMPRESSION: 1. Two punctate foci of acute ischemia within the left MCA territory. No hemorrhage or mass effect. 2. Findings of chronic microvascular ischemia and generalized volume loss. Electronically Signed   By: Ulyses Jarred M.D.   On: 05/23/2021 19:38   DG Chest Port 1 View  Result Date: 05/24/2021 CLINICAL DATA:  Shortness of breath EXAM: PORTABLE CHEST 1 VIEW COMPARISON:  Chest CT 05/19/2021 FINDINGS: Cardiomegaly with congested appearance of vessels. No visible effusion or pneumothorax. Mitral annular calcification. Artifact from EKG leads. IMPRESSION: Cardiomegaly and vascular congestion. Electronically Signed   By: Monte Fantasia M.D.   On: 05/24/2021 04:08        Scheduled Meds:  Chlorhexidine Gluconate Cloth  6 each Topical Q0600   pantoprazole (PROTONIX) IV  40 mg Intravenous Q24H   Continuous Infusions:  argatroban 0.75 mcg/kg/min (05/24/21 0442)   cefTRIAXone (ROCEPHIN)  IV 1 g (05/24/21 0900)     LOS: 1 day    Time spent: 39  minutes spent on chart review, discussion with nursing staff, consultants, updating family and interview/physical exam; more than 50% of that time was spent in counseling and/or coordination of care.    Lorie Cleckley J [place] DO [facility] Available via Epic secure chat 7am-7pm After these hours, please refer to coverage provider listed on [email or web address] 05/24/2021, 10:44 AM

## 2021-05-24 NOTE — Progress Notes (Signed)
   05/24/21 0137  Vitals  Temp (!) 97.5 F (36.4 C)  Temp Source Oral  BP (!) 96/56  MAP (mmHg) 69  BP Location Right Arm  BP Method Automatic  Patient Position (if appropriate) Lying  Pulse Rate 92  Pulse Rate Source Monitor  ECG Heart Rate 92  Resp (!) 36  Level of Consciousness  Level of Consciousness Alert  MEWS COLOR  MEWS Score Color Red  Oxygen Therapy  SpO2 96 %  O2 Device Nasal Cannula  O2 Flow Rate (L/min) 3 L/min  Patient Activity (if Appropriate) In bed  Pain Assessment  Pain Scale 0-10  Pain Score 0  MEWS Score  MEWS Temp 0  MEWS Systolic 1  MEWS Pulse 0  MEWS RR 3  MEWS LOC 0  MEWS Score 4  Back fro Dialysis. I.V. out and alert and orin. X 4. Failed swallowing bedside test unable to drink water without stopping. Red mews. Dr. Vicente Serene text jpaged and Tim R.N. aware.

## 2021-05-24 NOTE — Progress Notes (Addendum)
STROKE TEAM PROGRESS NOTE   INTERVAL HISTORY Per bedside RN became agitated this am due to IV start. Ativan has been given since then for next IV attempt.   Patient is very drowsy and unable to stay awake during my visit. She arouses briefly and answers some questions and follows some commands but is not alert enough for conversation. There is no family at bedside.   Per chart review family has chosen hospice path this afternoon for further care.   Vitals:   05/24/21 0815 05/24/21 0855 05/24/21 1000 05/24/21 1119  BP: (!) 75/63 96/64  122/90  Pulse: 81 83  81  Resp: (!) 35 20  (!) 36  Temp: (!) 96.6 F (35.9 C) 97.6 F (36.4 C) 98.6 F (37 C) (!) 97.5 F (36.4 C)  TempSrc: Axillary Oral  Oral  SpO2:  97%    Weight:      Height:       CBC:  Recent Labs  Lab 05/19/21 0820 05/22/21 1552  WBC 4.7 4.1  NEUTROABS 3.2  --   HGB 12.0 11.2*  HCT 38.1 35.6*  MCV 102.7* 103.2*  PLT 108* 387*   Basic Metabolic Panel:  Recent Labs  Lab 05/22/21 1552 05/23/21 0111  NA 144 142  K 4.0 3.9  CL 98 99  CO2 30 28  GLUCOSE 117* 155*  BUN 68* 74*  CREATININE 5.19* 5.29*  CALCIUM 10.3 10.4*  PHOS  --  5.0*   Lipid Panel:  Recent Labs  Lab 05/24/21 0714  CHOL 112  TRIG 102  HDL 27*  CHOLHDL 4.1  VLDL 20  LDLCALC 65   HgbA1c: No results for input(s): HGBA1C in the last 168 hours. Urine Drug Screen: No results for input(s): LABOPIA, COCAINSCRNUR, LABBENZ, AMPHETMU, THCU, LABBARB in the last 168 hours.  Alcohol Level No results for input(s): ETH in the last 168 hours.  IMAGING past 24 hours MR BRAIN W WO CONTRAST  Result Date: 05/23/2021 CLINICAL DATA:  Delirium EXAM: MRI HEAD WITHOUT AND WITH CONTRAST TECHNIQUE: Multiplanar, multiecho pulse sequences of the brain and surrounding structures were obtained without and with intravenous contrast. CONTRAST:  8.40mL GADAVIST GADOBUTROL 1 MMOL/ML IV SOLN COMPARISON:  None. FINDINGS: Brain: There are 2 punctate foci of abnormal  diffusion restriction within the left MCA territory. No other diffusion abnormality. No acute or chronic hemorrhage. There is multifocal hyperintense T2-weighted signal within the white matter. Generalized volume loss without a clear lobar predilection. The midline structures are normal. Vascular: Major flow voids are preserved. Skull and upper cervical spine: Normal calvarium and skull base. Visualized upper cervical spine and soft tissues are normal. Sinuses/Orbits:No paranasal sinus fluid levels or advanced mucosal thickening. No mastoid or middle ear effusion. Normal orbits. IMPRESSION: 1. Two punctate foci of acute ischemia within the left MCA territory. No hemorrhage or mass effect. 2. Findings of chronic microvascular ischemia and generalized volume loss. Electronically Signed   By: Ulyses Jarred M.D.   On: 05/23/2021 19:38   DG Chest Port 1 View  Result Date: 05/24/2021 CLINICAL DATA:  Shortness of breath EXAM: PORTABLE CHEST 1 VIEW COMPARISON:  Chest CT 05/19/2021 FINDINGS: Cardiomegaly with congested appearance of vessels. No visible effusion or pneumothorax. Mitral annular calcification. Artifact from EKG leads. IMPRESSION: Cardiomegaly and vascular congestion. Electronically Signed   By: Monte Fantasia M.D.   On: 05/24/2021 04:08    PHYSICAL EXAM  Temp:  [96.6 F (35.9 C)-98.7 F (37.1 C)] 97.5 F (36.4 C) (09/04 1119) Pulse Rate:  [  80-92] 81 (09/04 1119) Resp:  [17-36] 36 (09/04 1119) BP: (61-139)/(43-112) 122/90 (09/04 1119) SpO2:  [95 %-100 %] 97 % (09/04 0855) Weight:  [84 kg] 84 kg (09/04 0527)  General - Elderly female lying in bed with eyes closed in NAD.  Resp-Even and unlabored Skin- warm and dry   Mental Status -  Drowsy s/p Ativan for agitation Opens eyes briefly when name is called. Able to states she is in a hospital. Disoriented to date. Denies pain. Does not answer other questions.  Cranial Nerves II - XII - II - PERRL III, IV, VI -Focuses briefly on examiner and  does track examiner for brief period V - Facial sensation intact bilaterally VII - Facial movement intact bilaterally VIII - Hearing grossly intact  X - Does not open mouth to command XI -Does not cooperate with testing  XII - Tongue protrusion intact  Motor Strength - Tdoes not follow commands for formal testing but appears to be antigravity x 4 extremities with purposeful movmenet  Sensory - Did not answer sensory testing questions Coordination-used right arm/hand purposefully to resist nursing assessment of IV  Gait and Station - deferred.   ASSESSMENT/PLAN Sonya Beck is a 69 y.o. female with PMH significant for metastatic renal cell cancer s/p partial nephrectomy with recurrence and mets to liver, hx of endometrial cancer s/p hysterectomy, hx of DVT on eliquis, GERD, prior stroke in 2017 who is admitted with confusion and AMS x 2 weeks. Workup with UA consistent wit ha UTI and being treated with Ceftriaxone. MRI Brain was obtained as part of workup and specifically to evaluate for brain mets and demonstrated 2 punctate strokes in L MCA territory. Neurology was consulted for further workup for the noted strokes  Two punctate foci of acute ischemic strokes within the left MCA territory with unclear cause in the setting of possible prothrombotic state due to metastatic renal cell cancer.  CT head No acute abnormality.  MRI: 2 punctate strokes in L MCA territory. LDL 65 Further work up efforts and hemodialysis were discontinued today after family discussions with palliative care and primary team resulted in plan for transition to hospice focus of care.  Neurology will sign off.    Hospital day # 1  Delila Bailey-Modzik, NP-C  ATTENDING NOTE: I reviewed above note and agree with the assessment and plan. Pt was seen and examined.   70 year old female with history of renal cell carcinoma status post partial nephrectomy, metastasis to liver and peritoneal cavity, endometrial  cancer status post hysterectomy, DVT on Eliquis, hypertension, hyperlipidemia, ESRD on hemodialysis, CHF, severe aortic stenosis, stroke in 2017 admitted for altered mental status for 2 weeks.  Found to have UTI and put on Zosyn.  MRI showed left MCA frontal 2 punctate infarcts.  No brain metastasis.  EF in 02/2021 55 to 60%, with severe aortic stenosis.  LDL 65 and A1c pending.  LFTs normal range.  She was on argatroban infusion for anticoagulation given n.p.o. status with thrombocytopenia.  However, noted that after palliative care discussion with patient family, decision made to transition to hospice care.  CTA head and neck was canceled.  Neurology will sign off at this time, please call with further questions.  For detailed assessment and plan, please refer to above as I have made changes wherever appropriate.   Rosalin Hawking, MD PhD Stroke Neurology 05/24/2021 5:05 PM    To contact Stroke Continuity provider, please refer to [email or web address] After hours, contact General Neurology

## 2021-05-25 DIAGNOSIS — R0602 Shortness of breath: Secondary | ICD-10-CM

## 2021-05-25 MED ORDER — LORAZEPAM 2 MG/ML IJ SOLN
0.5000 mg | INTRAMUSCULAR | Status: DC
  Administered 2021-05-25 – 2021-05-26 (×8): 0.5 mg via INTRAVENOUS
  Filled 2021-05-25 (×9): qty 1

## 2021-05-25 MED ORDER — GLYCOPYRROLATE 0.2 MG/ML IJ SOLN: 0.3000 mg | INTRAMUSCULAR | Status: DC | PRN

## 2021-05-25 NOTE — TOC Progression Note (Signed)
Transition of Care [facility]) - Progression Note    Patient Details  Name: Jalan Bodi MRN: [id number] Date of Birth: 08-Nov-1950  Transition of Care [facility]) CM/SW [place], Elias-Fela Solis Phone Number: 05/25/2021, 9:51 AM  Clinical Narrative:    CSW contacted [facility] liaison via secure chat. They are unable to provide bed today. [facility] will work pt up for appropriateness for admission to [facility].    Expected Discharge Plan: [place] Barriers to Discharge: Hospice Bed not available  Expected Discharge Plan and Services Expected Discharge Plan: [place]     Post Acute Care Choice: Hospice                                         Social Determinants of Health (SDOH) Interventions    Readmission Risk Interventions No flowsheet data found.

## 2021-05-25 NOTE — Plan of Care (Signed)

## 2021-05-25 NOTE — Progress Notes (Signed)
[facility] ([facility]) Hospital Liaison note.    Received request from TOC manager for family interest in [facility]. Spoke with family to confirm interest and answer questions. [facility] is unable to offer a room today. Hospital Liaison will follow up tomorrow or sooner if a room becomes available.  Eligibility is confirmed.   Please do not hesitate to call with questions.    Thank you,   Mary Anne Robertson, RN, CCM      [facility] Hospital Liaison   [phone] 

## 2021-05-25 NOTE — Progress Notes (Signed)
   Daily Progress Note   Patient Name: Sonya Beck       Date: 05/25/2021 DOB: 1951/06/19  Age: 70 y.o. MRN#: [id number] Attending Physician: [place] Eric J, DO Primary Care Physician: Ronnell Freshwater, NP Admit Date: 05/22/2021  Reason for Consultation/Follow-up: Establishing goals of care  Subjective: Chart Reviewed. Updates Received. Patient Assessed.   Patient appears distressed. Turning back and forth in bed, grimacing, and tachypneic. Son is at the bedside and tearful. He shares he has spoken to [employer] regarding approval for [employer]. Emotional support provided.   Education provided on aggressive symptom management with a goal of comfort. He shares patient had a few minutes of clarity with him earlier today expressing his appreciation of the moments. He does not wish for her to be uncomfortable or in a suffering state if at all possible. Advised on adjustments to medications to gain a more comfortable and restful state. He verbalized understanding and appreciation.   All questions answered and support provided. RN updated and preparing to administer medications for comfort.    Length of Stay: 2 days  Vital Signs: BP (!) 101/44 (BP Location: Right Arm)   Pulse 86   Temp 97.6 F (36.4 C) (Oral)   Resp (!) 24   Ht 5\' 4"  (1.626 m)   Wt 84 kg   SpO2 97%   BMI 31.79 kg/m  SpO2: SpO2: 97 % O2 Device: O2 Device: Nasal Cannula O2 Flow Rate: O2 Flow Rate (L/min): 0.5 L/min  Physical Exam: Appears in mild distress, ill appearing Tachypneic, 1L/[place] RRR Somnolent, does not follow commands or respond to verbal stimuli, turning back and forth in bed and reaching around. Right hand mitt in place.              Palliative Care Assessment & Plan    Code Status: DNR  Goals of Care/Recommendations: Continue comfort focused care Aggressive symptom management with a goal of a restful and comfortable state.  Will schedule ativan Dilaudid PRN increase frequency Visitors  per EOL policy, son to stay overnight with patient for comfort.  Pending [employer] approval and bed availability PMT will continue to support and follow   Prognosis: < 2 weeks  Discharge Planning: Hospice facility  Thank you for allowing the Palliative Medicine Team to assist in the care of this patient.  Time Total: 45 min.   Visit consisted of counseling and education dealing with the complex and emotionally intense issues of symptom management and palliative care in the setting of serious and potentially life-threatening illness.Greater than 50%  of this time was spent counseling and coordinating care related to the above assessment and plan.  Alda Lea, AGPCNP-BC  Palliative Medicine Team [phone]

## 2021-05-25 NOTE — Progress Notes (Signed)
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  Son reported that since 8/15 after appointment with her oncologist, her personality has drastically change and often throws tantrums and falls onto the floor.  She has not had any additional medications in that time.  And was advised by her home health agencies to call EMS when she throws herself on the floor and unable to get back up.  Per son, at baseline she is able to ambulate with a rolling walker; but apparently only when she wants to.  Patient does not complain of any pain or current concerns.  She does still make urine.  Patient's son requests psychiatric evaluation due to her personality change.  In the ED, her 97.9 F, HR 81, RR 16, BP 96/76, SPO2 1% on room air.  144, potassium 4.0, chloride 98, CO2 30, glucose 117, BUN 68, creatinine 5.19, albumin 3.2, AST 25, ALT 21, total bilirubin 1.1.  WBC 4.1, hemoglobin 11.2, platelets 148.  Lipase 84.  INR 1.2.  Ammonia level level 12.  COVID-19/SARS-CoV-2 negative.  CT head without contrast with no acute intercranial normality, unchanged sequelae of chronic small vessel ischemic disease and cerebral atrophy.  Urinalysis with moderate leukocytes, negative nitrite, many bacteria, greater than 50 WBCs.  Duration consulted for further evaluation and management of acute metabolic encephalopathy concerning for UTI versus worsening metastatic disease vs developing dementia.   Assessment & Plan:   Principal Problem:   AMS (altered mental status) Active Problems:   History of DVT of lower extremity   Metastatic renal cell carcinoma to liver (HCC)   ESRD  on hemodialysis (HCC)   Weakness   Pressure injury of skin   Urinary tract infection   Acute metabolic encephalopathy   Cerebral thrombosis with cerebral infarction   History of metastatic renal cell carcinoma with hepatic involvement Patient follows with medical oncology, Dr. Alen Blew outpatient.  Originally diagnosed February 2022.  Patient previously received ipilimumab and nivolumab 1 cycle in June 2022 while living in [place] which was discontinued due to poor tolerance.  Most recently started on Cabometyx July 2022 with therapy currently on hold due to poor toleration with GI toxicity including nausea/vomiting.  MR brain with no metastatic lesions appreciated.  Palliative care was consulted and followed during hospital course.  Given her metastatic process and poor toleration with chemotherapy; overall prognosis was grim.  Discussion with patient's son along with palliative care on 05/25/2019 to determine best course was to focus on comfort. --Pending [facility] transfer --Supportive care with Robinul, Dilaudid, Ativan  Acute metabolic encephalopathy, POA Patient presenting to the ED from home with progressive confusion and mental status changes.  Patient is afebrile without leukocytosis.  Urinalysis consistent with UTI as she still makes urine although on HD.  Also with noted acute CVA on MRI with also likely contributing factor of developing dementia with cerebral atrophy noted on imaging.  Initially started on empiric antibiotic therapy, now transition to comfort measures.  Acute CVA Brain with and without contrast 9/3 with 2 punctate foci of acute ischemia within the left MCA territory without hemorrhage/mass-effect; Chronic microvascular ischemia and generalized volume loss.  TTE  03/19/21 with LVEF 55-60%, no LV R WMA, moderate LVH, RV systolic function mildly reduced, elevated PASP, LA mildly dilated, RA mildly dilated, IVC dilated, small pericardial effusion without evidence of  tamponade physiology, no intra-atrial shunt identified.  LDL 65.  Neurology was consulted and followed during hospital course.  Now transition to comfort measures.  Dyspnea After hemodialysis earlier this morning, patient with increased tachypnea.  Chest x-ray overnight with cardiomegaly and vascular congestion.  ABG with pH 7.52, PaCO2 31.2, PO2 129.  Given patient's continued decline, discussion with patient's son along with palliative care determined best course to transition to comfort measures. --Check CT angiogram chest PE --DuoNebs as needed --Supplemental oxygen, maintain SPO2 greater than 92%; currently on 0.5 L nasal cannula --Volume management with HD  Urinary tract infection Urinalysis with moderate leukocytes, negative nitrite, many bacteria and greater than 50 WBCs.  Patient is afebrile without leukocytosis.  Could be a contributing factor to her confusion.  ESRD on HD TTS Follows with nephrology outpatient, dialyzes at [place] [facility].  Nephrology was consulted initially during hospital course, now transitioning to residential hospice.  Hemodialysis now discontinued.  Hx DVT Discontinued Eliquis now on comfort measures.  Buttocks, stage I pressure injury of skin, POA Pressure Injury 05/23/21 Buttocks Mid Stage 1 -  Intact skin with non-blanchable redness of a localized area usually over a bony prominence. (Active)  05/23/21 0200  Location: Buttocks  Location Orientation: Mid  Staging: Stage 1 -  Intact skin with non-blanchable redness of a localized area usually over a bony prominence.  Wound Description (Comments):   Present on Admission: Yes  --continue local wound care, offloading  GERD: Protonix 40 mg IV every 24 hours  DVT prophylaxis:   None on comfort measures   Code Status: DNR Family Communication: Updated patient's son [name] at bedside this morning  Disposition Plan:  Level of care: Med-Surg Status is: Inpatient  Remains inpatient appropriate  because:Hemodynamically unstable, Altered mental status, Ongoing diagnostic testing needed not appropriate for outpatient work up, Unsafe d/c plan, IV treatments appropriate due to intensity of illness or inability to take PO, and Inpatient level of care appropriate due to severity of illness  Dispo:  Patient From: Home  Planned Disposition: [place]  Medically stable for discharge: No      Consultants:  Nephrology, Dr. Candiss Norse - signed off 9/4 Palliative care Neurology - signed off 9/4   Procedures:  None  Antimicrobials:  Ceftriaxone 9/3 - 9/4   Subjective: Patient seen examined at bedside, head.  Sleeping, somnolent.  Son present at bedside.  Awaiting bed availability at [facility].  Son very appreciative all the care her mom is receiving in the hospital.   Objective: Vitals:   05/24/21 1000 05/24/21 1119 05/24/21 2018 05/25/21 0752  BP:  122/90 97/69 (!) 101/44  Pulse:  81 86 86  Resp:  (!) 36 (!) 30 (!) 24  Temp: 98.6 F (37 C) (!) 97.5 F (36.4 C) 97.7 F (36.5 C) 97.6 F (36.4 C)  TempSrc:  Oral Oral Oral  SpO2:   100% 97%  Weight:      Height:        Intake/Output Summary (Last 24 hours) at 05/25/2021 1104 Last data filed at 05/24/2021 2018 Gross per 24 hour  Intake 32.6 ml  Output --  Net 32.6 ml   Filed Weights   05/22/21 1546 05/24/21 0527  Weight: 84 kg 84 kg    Examination:  General exam: Slightly dyspneic, uncomfortable in appearance, somnolent  Respiratory system: Clear to auscultation.  On nasal cannula Cardiovascular system: S1 & S2 heard, RRR. No JVD, murmurs, rubs, gallops or clicks. No pedal edema. Gastrointestinal system: Abdomen is nondistended, soft and nontender. No organomegaly or masses felt. Normal bowel sounds heard. Central nervous system: Somnolent, does not respond to verbal command Extremities: Observed moving all extremities independently Skin: No rashes, lesions or ulcers Psychiatry: Unable to assess due to current mental  status    Data Reviewed: I have personally reviewed following labs and imaging studies  CBC: Recent Labs  Lab 05/19/21 0820 05/22/21 1552  WBC 4.7 4.1  NEUTROABS 3.2  --   HGB 12.0 11.2*  HCT 38.1 35.6*  MCV 102.7* 103.2*  PLT 108* 160*   Basic Metabolic Panel: Recent Labs  Lab 05/19/21 0820 05/22/21 1552 05/23/21 0111  NA 141 144 142  K 4.0 4.0 3.9  CL 98 98 99  CO2 26 30 28   GLUCOSE 122* 117* 155*  BUN 65* 68* 74*  CREATININE 5.03* 5.19* 5.29*  CALCIUM 10.4* 10.3 10.4*  PHOS  --   --  5.0*   GFR: Estimated Creatinine Clearance: 10.1 mL/min (A) (by C-G formula based on SCr of 5.29 mg/dL (H)). Liver Function Tests: Recent Labs  Lab 05/19/21 0820 05/22/21 1552 05/23/21 0111  AST 26 25  --   ALT 23 21  --   ALKPHOS 79 80  --   BILITOT 1.6* 1.1  --   PROT 7.2 6.8  --   ALBUMIN 3.4* 3.2* 3.1*   Recent Labs  Lab 05/22/21 1947  LIPASE 84*   Recent Labs  Lab 05/22/21 1947  AMMONIA 12   Coagulation Profile: Recent Labs  Lab 05/19/21 0820 05/22/21 1947  INR 1.2 1.2   Cardiac Enzymes: No results for input(s): CKTOTAL, CKMB, CKMBINDEX, TROPONINI in the last 168 hours. BNP (last 3 results) No results for input(s): PROBNP in the last 8760 hours. HbA1C: No results for input(s): HGBA1C in the last 72 hours. CBG: Recent Labs  Lab 05/22/21 2217 05/24/21 1121  GLUCAP 103* 111*   Lipid Profile: Recent Labs    05/24/21 0714  CHOL 112  HDL 27*  LDLCALC 65  TRIG 102  CHOLHDL 4.1   Thyroid Function Tests: No results for input(s): TSH, T4TOTAL, FREET4, T3FREE, THYROIDAB in the last 72 hours. Anemia Panel: No results for input(s): VITAMINB12, FOLATE, FERRITIN, TIBC, IRON, RETICCTPCT in the last 72 hours. Sepsis Labs: No results for input(s): PROCALCITON, LATICACIDVEN in the last 168 hours.  Recent Results (from the past 240 hour(s))  SARS CORONAVIRUS 2 (TAT 6-24 HRS) Nasopharyngeal Nasopharyngeal Swab     Status: None   Collection Time:  05/23/21  1:08 AM   Specimen: Nasopharyngeal Swab  Result Value Ref Range Status   SARS Coronavirus 2 NEGATIVE NEGATIVE Final    Comment: (NOTE) SARS-CoV-2 target nucleic acids are NOT DETECTED.  The SARS-CoV-2 RNA is generally detectable in upper and lower respiratory specimens during the acute phase of infection. Negative results do not preclude SARS-CoV-2 infection, do not rule out co-infections with other pathogens, and should not be used as the sole basis for treatment or other patient management decisions. Negative results must be combined with clinical observations, patient history, and epidemiological information. The expected result is Negative.  Fact Sheet for Patients: SugarRoll.be  Fact Sheet for Healthcare Providers: https://www.woods-mathews.com/  This test is not yet approved or cleared by the [place] FDA and  has been authorized for detection and/or diagnosis of SARS-CoV-2 by  FDA under an Emergency Use Authorization (EUA). This EUA will remain  in effect (meaning this test can be used) for the duration of the COVID-19 declaration under Se ction 564(b)(1) of the Act, 21 U.S.C. section 360bbb-3(b)(1), unless the authorization is terminated or revoked sooner.  Performed at [facility], [place] [address], [place], [place] [address]   Urine Culture     Status: Abnormal (Preliminary result)   Collection Time: 05/23/21  7:04 AM   Specimen: Urine, Clean Catch  Result Value Ref Range Status   Specimen Description URINE, CLEAN CATCH  Final   Special Requests   Final    NONE Performed at [facility], [place] [address], [place], [place] [address]    Culture >=100,000 COLONIES/mL GRAM NEGATIVE RODS (A)  Final   Report Status PENDING  Incomplete  Culture, blood (routine x 2)     Status: None (Preliminary result)   Collection Time: 05/23/21  9:05 AM   Specimen: BLOOD  Result Value Ref Range Status   Specimen  Description BLOOD RIGHT ANTECUBITAL  Final   Special Requests AEROBIC BOTTLE ONLY Blood Culture adequate volume  Final   Culture   Final    NO GROWTH 2 DAYS Performed at [facility], [place] [address] [place], [place] [address]    Report Status PENDING  Incomplete  Culture, blood (routine x 2)     Status: None (Preliminary result)   Collection Time: 05/23/21  9:05 AM   Specimen: BLOOD  Result Value Ref Range Status   Specimen Description BLOOD BLOOD RIGHT HAND  Final   Special Requests AEROBIC BOTTLE ONLY Blood Culture adequate volume  Final   Culture   Final    NO GROWTH 2 DAYS Performed at [facility], [place] [address] [place], [place] [address]    Report Status PENDING  Incomplete  MRSA Next Gen by PCR, Nasal     Status: None   Collection Time: 05/24/21  2:05 AM   Specimen: Nasal Mucosa; Nasal Swab  Result Value Ref Range Status   MRSA by PCR Next Gen NOT DETECTED NOT DETECTED Final    Comment: (NOTE) The GeneXpert MRSA Assay (FDA approved for NASAL specimens only), is one component of a comprehensive MRSA colonization surveillance program. It is not intended to diagnose MRSA infection nor to guide or monitor treatment for MRSA infections. Test performance is not FDA approved in patients less than 63 years old. Performed at [facility], [place] [address] [place], [place] [address]          Radiology Studies: MR BRAIN W WO CONTRAST  Result Date: 05/23/2021 CLINICAL DATA:  Delirium EXAM: MRI HEAD WITHOUT AND WITH CONTRAST TECHNIQUE: Multiplanar, multiecho pulse sequences of the brain and surrounding structures were obtained without and with intravenous contrast. CONTRAST:  8.25mL GADAVIST GADOBUTROL 1 MMOL/ML IV SOLN COMPARISON:  None. FINDINGS: Brain: There are 2 punctate foci of abnormal diffusion restriction within the left MCA territory. No other diffusion abnormality. No acute or chronic hemorrhage. There is multifocal hyperintense T2-weighted signal  within the white matter. Generalized volume loss without a clear lobar predilection. The midline structures are normal. Vascular: Major flow voids are preserved. Skull and upper cervical spine: Normal calvarium and skull base. Visualized upper cervical spine and soft tissues are normal. Sinuses/Orbits:No paranasal sinus fluid levels or advanced mucosal thickening. No mastoid or middle ear effusion. Normal orbits. IMPRESSION: 1. Two punctate foci of acute ischemia within the left MCA  territory. No hemorrhage or mass effect. 2. Findings of chronic microvascular ischemia and generalized volume loss. Electronically Signed   By: Ulyses Jarred M.D.   On: 05/23/2021 19:38   DG Chest Port 1 View  Result Date: 05/24/2021 CLINICAL DATA:  Shortness of breath EXAM: PORTABLE CHEST 1 VIEW COMPARISON:  Chest CT 05/19/2021 FINDINGS: Cardiomegaly with congested appearance of vessels. No visible effusion or pneumothorax. Mitral annular calcification. Artifact from EKG leads. IMPRESSION: Cardiomegaly and vascular congestion. Electronically Signed   By: Monte Fantasia M.D.   On: 05/24/2021 04:08        Scheduled Meds:  pantoprazole (PROTONIX) IV  40 mg Intravenous Q24H   Continuous Infusions:     LOS: 2 days    Time spent: 29 minutes spent on chart review, discussion with nursing staff, consultants, updating family and interview/physical exam; more than 50% of that time was spent in counseling and/or coordination of care.    Eleni Frank J [place] DO [facility] Available via Epic secure chat 7am-7pm After these hours, please refer to coverage provider listed on [email or web address] 05/25/2021, 11:04 AM

## 2021-05-26 DIAGNOSIS — Z515 Encounter for palliative care: Secondary | ICD-10-CM

## 2021-05-26 LAB — URINE CULTURE: Culture: >=100000 — AB

## 2021-05-26 LAB — HEMOGLOBIN A1C
Hgb A1c MFr Bld: 5.5 % (ref 4.8–5.6)
Mean Plasma Glucose: 111 mg/dL

## 2021-05-26 MED ORDER — LORAZEPAM 2 MG/ML IJ SOLN: 0.5000 mg | Freq: Four times a day (QID) | INTRAMUSCULAR | 0 refills | Status: AC | PRN

## 2021-05-26 MED ORDER — GLYCOPYRROLATE 0.2 MG/ML IJ SOLN: 0.3000 mg | INTRAMUSCULAR | Status: AC | PRN

## 2021-05-26 MED ORDER — LORAZEPAM 2 MG/ML IJ SOLN: 0.5000 mg | INTRAMUSCULAR | 0 refills | Status: AC

## 2021-05-26 NOTE — Discharge Summary (Signed)
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  Son reported that since 8/15 after appointment with her oncologist, her personality has drastically change and often throws tantrums and falls onto the floor.  She has not had any additional medications in that time.  And was advised by her home health agencies to call EMS when she throws herself on the floor and unable to get back up.  Per son, at baseline she is able to ambulate with a rolling walker; but apparently only when she wants to.  Patient does not complain of any pain or current concerns.  She does still make urine.  Patient's son requests psychiatric evaluation due to her personality change.   In the ED, her 97.9 F, HR 81, RR 16, BP 96/76, SPO2 1% on room air.  144, potassium 4.0, chloride 98, CO2 30, glucose 117, BUN 68, creatinine 5.19, albumin 3.2, AST 25, ALT 21, total bilirubin 1.1.  WBC 4.1, hemoglobin 11.2, platelets 148.  Lipase 84.  INR 1.2.  Ammonia level level 12.  COVID-19/SARS-CoV-2 negative.  CT head without contrast with no acute intercranial normality, unchanged sequelae of chronic small vessel ischemic disease and cerebral atrophy.  Urinalysis with moderate leukocytes, negative nitrite, many bacteria, greater than 50 WBCs.  Duration consulted for further  evaluation and management of acute metabolic encephalopathy concerning for UTI versus worsening metastatic disease vs developing dementia.  Hospital course:  History of metastatic renal cell carcinoma with hepatic involvement Patient follows with medical oncology, Dr. Alen Blew outpatient.  Originally diagnosed February 2022.  Patient previously received ipilimumab and nivolumab 1 cycle in June 2022 while living in [place] which was discontinued due to poor tolerance.  Most recently started on Cabometyx July 2022 with therapy currently on hold due to poor toleration with GI toxicity including nausea/vomiting.  MR brain with no metastatic lesions appreciated.  Palliative care was consulted and followed during hospital course.  Given her metastatic process and poor toleration with chemotherapy; overall prognosis was grim.  Discussion with patient's son along with palliative care on 05/25/2019 to determine best course was to focus on comfort. Supportive care with Robinul, Dilaudid, Ativan.  Discharging to [facility].   Acute metabolic encephalopathy, POA Patient presenting to the ED from home with progressive confusion and mental status changes.  Patient is afebrile without leukocytosis.  Urinalysis consistent with UTI as she still makes urine although on HD.  Also with noted acute CVA on MRI with also likely contributing factor of developing dementia with cerebral atrophy noted on imaging.  Initially started on empiric antibiotic therapy, now transition to comfort measures.   Acute CVA Brain with and without contrast 9/3 with 2 punctate foci of acute ischemia within the left MCA territory without hemorrhage/mass-effect; Chronic microvascular ischemia and generalized volume loss.  TTE 03/19/21 with LVEF 55-60%, no LV R WMA, moderate LVH, RV systolic function mildly reduced, elevated PASP,  LA mildly dilated, RA mildly dilated, IVC dilated, small pericardial effusion without evidence of  tamponade physiology, no intra-atrial shunt identified.  LDL 65.  Neurology was consulted and followed during hospital course.  Now transition to comfort measures.   Dyspnea After hemodialysis earlier this morning, patient with increased tachypnea.  Chest x-ray overnight with cardiomegaly and vascular congestion.  ABG with pH 7.52, PaCO2 31.2, PO2 129.  Given patient's continued decline, discussion with patient's son along with palliative care determined best course to transition to comfort measures.   Urinary tract infection Urinalysis with moderate leukocytes, negative nitrite, many bacteria and greater than 50 WBCs.  Patient is afebrile without leukocytosis.  Could be a contributing factor to her confusion.   ESRD on HD TTS Follows with nephrology outpatient, dialyzes at [facility].  Nephrology was consulted initially during hospital course, now transitioning to residential hospice.  Hemodialysis now discontinued.   Hx DVT Discontinued Eliquis now on comfort measures.   Buttocks, stage I pressure injury of skin, POA Pressure Injury 05/23/21 Buttocks Mid Stage 1 -  Intact skin with non-blanchable redness of a localized area usually over a bony prominence. (Active)  05/23/21 0200  Location: Buttocks  Location Orientation: Mid  Staging: Stage 1 -  Intact skin with non-blanchable redness of a localized area usually over a bony prominence.  Wound Description (Comments):   Present on Admission: Yes  --continue local wound care, offloading     Discharge Diagnoses:  Principal Problem:   AMS (altered mental status) Active Problems:   Hospice care   History of DVT of lower extremity   Metastatic renal cell carcinoma to liver (HCC)   ESRD on hemodialysis ([place])   Weakness   Pressure injury of skin   Urinary tract infection   Acute metabolic encephalopathy   Cerebral thrombosis with cerebral infarction   Palliative care patient    Discharge Instructions  Discharge Instructions      Diet - low sodium heart healthy   Complete by: As directed    Increase activity slowly   Complete by: As directed    No wound care   Complete by: As directed    Nursing communication   Complete by: As directed    Leave IV access in place on dc to [facility] place      Allergies as of 05/26/2021       Reactions   Heparin Other (See Comments)   HITT   Penicillins Swelling   Swelling in mouth        Medication List     STOP taking these medications    apixaban 5 MG Tabs tablet Commonly known as: Eliquis   Cabometyx 40 MG tablet Generic drug: cabozantinib   ENSURE ORIGINAL PO   iron sucrose in sodium chloride 0.9 % 100 mL   ondansetron 4 MG tablet Commonly known as: ZOFRAN   pantoprazole 40 MG tablet Commonly known as: PROTONIX   promethazine 12.5 MG suppository Commonly known as: PHENERGAN       TAKE these medications    glycopyrrolate 0.2 MG/ML injection Commonly known as: ROBINUL Inject 1.5 mLs (0.3 mg total) into the vein every 4 (four) hours as needed (excessive secretions).   LORazepam 2 MG/ML injection Commonly known as: ATIVAN Inject 0.25 mLs (0.5 mg total) into the vein every 6 (six) hours as needed for anxiety.   LORazepam 2 MG/ML injection Commonly known as: ATIVAN Inject 0.25 mLs (0.5 mg total) into the vein every 4 (four) hours.  [place] Follow up.   Specialty: Home Health Services Why: [facility] will continue home health services. Contact information: [address] [place] [address] [phone]                Allergies  Allergen Reactions   Heparin Other (See Comments)    HITT   Penicillins Swelling    Swelling in mouth     Consultations: Nephrology Palliative care   Procedures/Studies: CT ABDOMEN PELVIS WO CONTRAST  Result Date: 05/07/2021 CLINICAL DATA:  Fall, renal disease, level 2 trauma EXAM: CT ABDOMEN AND PELVIS  WITHOUT CONTRAST TECHNIQUE: Multidetector CT imaging of the abdomen and pelvis was performed following the standard protocol without IV contrast. COMPARISON:  CT abdomen pelvis 04/03/2021 FINDINGS: Lower chest: Small bilateral pleural effusions. Large heart with mitral annular calcifications. Small pericardial effusion. Hepatobiliary: Scattered hyperdense lesions in the liver and multiple small hypodense liver lesions, poorly evaluated out intravenous contrast, similar in distribution and appearance in comparison to recent CT in July. Nonspecific gallbladder wall thickening. No intra or extrahepatic biliary ductal dilation. Pancreas: Unremarkable. No pancreatic ductal dilatation or surrounding inflammatory changes. Spleen: Normal in size without focal abnormality. Adrenals/Urinary Tract: Adrenal glands are unremarkable. Atrophic kidneys with postprocedural changes involving the lower pole the left kidney. Unchanged right lower pole renal cysts. No hydronephrosis. The bladder is minimally distended. Stomach/Bowel: The stomach is within normal limits. There is no evidence of bowel obstruction. The appendix is normal. Colonic diverticulosis without evidence of diverticulitis. Vascular/Lymphatic: Aortoiliac atherosclerotic calcifications. No AAA. No lymphadenopathy. Reproductive: Prior hysterectomy. Other: Increased, small volume abdominopelvic ascites. Diffuse body wall edema. Small fat containing umbilical hernia. There is a lobular peritoneal mass along the left peripheral hemiabdomen measuring 5.7 x 3.6 cm (series 3, image 30). This previously demonstrated enhancement on prior exam in February 2022. Musculoskeletal: No acute osseous abnormality. Multilevel degenerative changes of the spine. IMPRESSION: No evidence of acute trauma in the abdomen or pelvis. Unchanged appearance and distribution of multiple hyperdense and hypodense liver lesions which are compatible with multiple treated and possibly small residual  metastases. Unchanged lobular peritoneal metastasis in the peripheral left hemiabdomen. Increased, small volume abdominopelvic ascites. Nonspecific gallbladder wall thickening, which could be related to hepatocellular disease. Small bilateral pleural effusions.  Small pericardial effusion. Electronically Signed   By: Maurine Simmering M.D.   On: 05/07/2021 18:22   DG Chest 1 View  Result Date: 05/07/2021 CLINICAL DATA:  Trauma. No additional history provided or available. EXAM: CHEST  1 VIEW COMPARISON:  Radiograph and CT 04/03/2021 FINDINGS: Chronic cardiomegaly. Aortic atherosclerosis. Slight vascular congestion. No pneumothorax or focal airspace disease. No pleural fluid. No acute osseous abnormalities are seen. The bones are under mineralized. IMPRESSION: Chronic cardiomegaly with slight vascular congestion. No traumatic injury. Electronically Signed   By: Keith Rake M.D.   On: 05/07/2021 17:50   DG Thoracic Spine 2 View  Result Date: 05/22/2021 CLINICAL DATA:  Fall. EXAM: THORACIC SPINE 2 VIEWS COMPARISON:  Reformats from chest CT 3 days ago. FINDINGS: Twelve thoracic type vertebra. No evidence of fracture. Mild diffuse degenerative disc disease. Vertebral body heights are normal. Cardiomegaly which is similar to recent CT. No paravertebral soft tissue abnormality. IMPRESSION: Mild diffuse degenerative disc disease without acute fracture. Electronically Signed   By: Keith Rake M.D.   On: 05/22/2021 19:41   DG Lumbar Spine Complete  Result Date: 05/22/2021 CLINICAL DATA:  Post fall. EXAM:  LUMBAR SPINE - COMPLETE 4+ VIEW COMPARISON:  Reformats from abdominopelvic CT 3 days ago 05/19/2021 FINDINGS: Bones are subjectively under mineralized. No acute fracture. Vertebral body heights are normal. Multilevel degenerative disc disease. Multilevel facet hypertrophy. Degenerative change of the sacroiliac joints which remain congruent. Vascular calcifications are seen. IMPRESSION: 1. No acute fracture. 2.  Multilevel degenerative disc disease and facet hypertrophy. Electronically Signed   By: Keith Rake M.D.   On: 05/22/2021 20:17   DG Tibia/Fibula Right  Result Date: 05/19/2021 CLINICAL DATA:  Fall with pain. EXAM: RIGHT TIBIA AND FIBULA - 2 VIEW COMPARISON:  None. FINDINGS: Study reviewed along with the knee exam performed at the same time. No evidence for an acute fracture. No worrisome lytic or sclerotic osseous abnormality. IMPRESSION: Negative. Electronically Signed   By: Misty Stanley M.D.   On: 05/19/2021 09:35   CT HEAD WO CONTRAST (5MM)  Result Date: 05/22/2021 CLINICAL DATA:  Mental status change, unknown cause EXAM: CT HEAD WITHOUT CONTRAST TECHNIQUE: Contiguous axial images were obtained from the base of the skull through the vertex without intravenous contrast. COMPARISON:  Head CT 05/19/2021 FINDINGS: Brain: No evidence of acute intracranial hemorrhage or extra-axial collection.No evidence of mass lesion/concern mass effect.The ventricles are unchanged in size.Confluent periventricular and subcortical white matter hypoattenuation, which is nonspecific but likely sequela of chronic small vessel ischemic disease.Mild cerebral atrophy. Unchanged encephalomalacia in the right MCA territory from prior infarct. Vascular: No hyperdense vessel or unexpected calcification. Skull: Normal. Negative for fracture or focal lesion. Sinuses/Orbits: The paranasal sinuses are predominantly clear. Other: None. IMPRESSION: No acute intracranial abnormality. Unchanged sequela of chronic small vessel ischemic disease and cerebral atrophy. 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 Electronically Signed   By: Maurine Simmering M.D.   On: 05/22/2021 19:58   CT Head Wo Contrast  Result Date: 05/19/2021 CLINICAL DATA:  Level 2 fall.  Trauma. EXAM: CT HEAD WITHOUT CONTRAST CT CERVICAL SPINE WITHOUT CONTRAST TECHNIQUE: Multidetector CT imaging of the head and cervical spine was performed following the standard protocol  without intravenous contrast. Multiplanar CT image reconstructions of the cervical spine were also generated. COMPARISON:  05/07/2021 FINDINGS: CT HEAD FINDINGS Brain: There is no evidence for acute hemorrhage, hydrocephalus, mass lesion, or abnormal extra-axial fluid collection. No definite CT evidence for acute infarction. Diffuse loss of parenchymal volume is consistent with atrophy. Patchy low attenuation in the deep hemispheric and periventricular white matter is nonspecific, but likely reflects chronic microvascular ischemic demyelination. Small area of encephalomalacia in the right MCA territory is stable, consistent with old infarct. Vascular: No hyperdense vessel or unexpected calcification. Skull: No evidence for fracture. No worrisome lytic or sclerotic lesion. Sinuses/Orbits: The visualized paranasal sinuses and mastoid air cells are clear. Visualized portions of the globes and intraorbital fat are unremarkable. Other: None. CT CERVICAL SPINE FINDINGS Alignment: Straightening of normal cervical lordosis without subluxation. Skull base and vertebrae: No acute fracture. No primary bone lesion or focal pathologic process. Soft tissues and spinal canal: 9 mm right thyroid nodule. Not clinically significant; no follow-up imaging recommended (ref: J Am Coll Radiol. 2015 Feb;12(2): 143-50). Disc levels: Loss of disc height noted at C4-5, C5-6, C6-7, and C7-T1 with endplate degeneration associated. Upper chest: Unremarkable. Other: None. IMPRESSION: 1. No acute intracranial abnormality. Atrophy with chronic small vessel white matter ischemic disease. Old right MCA territory infarct. 2. Degenerative changes in the cervical spine without fracture. 3. Loss of cervical lordosis. This can be related to patient positioning, muscle spasm or soft tissue injury.  Electronically Signed   By: Misty Stanley M.D.   On: 05/19/2021 09:08   CT HEAD WO CONTRAST (5MM)  Result Date: 05/07/2021 CLINICAL DATA:  Level 2 trauma. Pt  fell. Takes blood thinners. Pt c/o abdominal pain. AMS EXAM: CT HEAD WITHOUT CONTRAST CT CERVICAL SPINE WITHOUT CONTRAST TECHNIQUE: Multidetector CT imaging of the head and cervical spine was performed following the standard protocol without intravenous contrast. Multiplanar CT image reconstructions of the cervical spine were also generated. COMPARISON:  None. FINDINGS: CT HEAD FINDINGS Brain: Cerebral ventricle sizes are concordant with the degree of cerebral volume loss. Patchy and confluent areas of decreased attenuation are noted throughout the deep and periventricular white matter of the cerebral hemispheres bilaterally, compatible with chronic microvascular ischemic disease. No evidence of large-territorial acute infarction. No parenchymal hemorrhage. No mass lesion. No extra-axial collection. No mass effect or midline shift. No hydrocephalus. Basilar cisterns are patent. Vascular: No hyperdense vessel. Atherosclerotic calcifications are present within the cavernous internal carotid and vertebral arteries. Skull: No acute fracture or focal lesion. Sinuses/Orbits: Paranasal sinuses and mastoid air cells are clear. The orbits are unremarkable. Other: Left parietooccipital scalp subcutaneus soft tissue edema and emphysema within associated 6 mm scalp hematoma formation. CT CERVICAL SPINE FINDINGS Alignment: Normal. Skull base and vertebrae: Multilevel degenerative changes of the spine. Associated severe osseous neural foraminal stenosis at multiple levels. No severe osseous central canal stenosis peer no acute fracture. No aggressive appearing focal osseous lesion or focal pathologic process. Soft tissues and spinal canal: No prevertebral fluid or swelling. No visible canal hematoma. Upper chest: Unremarkable. Other: None. IMPRESSION: 1. No acute intracranial abnormality. 2. Left parietal scalp 6 mm hematoma and associated emphysema with no definite underlying calvarial skull fracture. 3. No acute displaced  fracture or traumatic listhesis of the cervical spine. 4. Multilevel degenerative changes of the spine with severe osseous neural foraminal stenosis. Electronically Signed   By: Iven Finn M.D.   On: 05/07/2021 18:15   CT Cervical Spine Wo Contrast  Result Date: 05/19/2021 CLINICAL DATA:  Level 2 fall.  Trauma. EXAM: CT HEAD WITHOUT CONTRAST CT CERVICAL SPINE WITHOUT CONTRAST TECHNIQUE: Multidetector CT imaging of the head and cervical spine was performed following the standard protocol without intravenous contrast. Multiplanar CT image reconstructions of the cervical spine were also generated. COMPARISON:  05/07/2021 FINDINGS: CT HEAD FINDINGS Brain: There is no evidence for acute hemorrhage, hydrocephalus, mass lesion, or abnormal extra-axial fluid collection. No definite CT evidence for acute infarction. Diffuse loss of parenchymal volume is consistent with atrophy. Patchy low attenuation in the deep hemispheric and periventricular white matter is nonspecific, but likely reflects chronic microvascular ischemic demyelination. Small area of encephalomalacia in the right MCA territory is stable, consistent with old infarct. Vascular: No hyperdense vessel or unexpected calcification. Skull: No evidence for fracture. No worrisome lytic or sclerotic lesion. Sinuses/Orbits: The visualized paranasal sinuses and mastoid air cells are clear. Visualized portions of the globes and intraorbital fat are unremarkable. Other: None. CT CERVICAL SPINE FINDINGS Alignment: Straightening of normal cervical lordosis without subluxation. Skull base and vertebrae: No acute fracture. No primary bone lesion or focal pathologic process. Soft tissues and spinal canal: 9 mm right thyroid nodule. Not clinically significant; no follow-up imaging recommended (ref: J Am Coll Radiol. 2015 Feb;12(2): 143-50). Disc levels: Loss of disc height noted at C4-5, C5-6, C6-7, and C7-T1 with endplate degeneration associated. Upper chest:  Unremarkable. Other: None. IMPRESSION: 1. No acute intracranial abnormality. Atrophy with chronic small vessel white matter ischemic disease.  Old right MCA territory infarct. 2. Degenerative changes in the cervical spine without fracture. 3. Loss of cervical lordosis. This can be related to patient positioning, muscle spasm or soft tissue injury. Electronically Signed   By: Misty Stanley M.D.   On: 05/19/2021 09:08   CT Cervical Spine Wo Contrast  Result Date: 05/07/2021 CLINICAL DATA:  Level 2 trauma. Pt fell. Takes blood thinners. Pt c/o abdominal pain. AMS EXAM: CT HEAD WITHOUT CONTRAST CT CERVICAL SPINE WITHOUT CONTRAST TECHNIQUE: Multidetector CT imaging of the head and cervical spine was performed following the standard protocol without intravenous contrast. Multiplanar CT image reconstructions of the cervical spine were also generated. COMPARISON:  None. FINDINGS: CT HEAD FINDINGS Brain: Cerebral ventricle sizes are concordant with the degree of cerebral volume loss. Patchy and confluent areas of decreased attenuation are noted throughout the deep and periventricular white matter of the cerebral hemispheres bilaterally, compatible with chronic microvascular ischemic disease. No evidence of large-territorial acute infarction. No parenchymal hemorrhage. No mass lesion. No extra-axial collection. No mass effect or midline shift. No hydrocephalus. Basilar cisterns are patent. Vascular: No hyperdense vessel. Atherosclerotic calcifications are present within the cavernous internal carotid and vertebral arteries. Skull: No acute fracture or focal lesion. Sinuses/Orbits: Paranasal sinuses and mastoid air cells are clear. The orbits are unremarkable. Other: Left parietooccipital scalp subcutaneus soft tissue edema and emphysema within associated 6 mm scalp hematoma formation. CT CERVICAL SPINE FINDINGS Alignment: Normal. Skull base and vertebrae: Multilevel degenerative changes of the spine. Associated severe  osseous neural foraminal stenosis at multiple levels. No severe osseous central canal stenosis peer no acute fracture. No aggressive appearing focal osseous lesion or focal pathologic process. Soft tissues and spinal canal: No prevertebral fluid or swelling. No visible canal hematoma. Upper chest: Unremarkable. Other: None. IMPRESSION: 1. No acute intracranial abnormality. 2. Left parietal scalp 6 mm hematoma and associated emphysema with no definite underlying calvarial skull fracture. 3. No acute displaced fracture or traumatic listhesis of the cervical spine. 4. Multilevel degenerative changes of the spine with severe osseous neural foraminal stenosis. Electronically Signed   By: Iven Finn M.D.   On: 05/07/2021 18:15   MR BRAIN W WO CONTRAST  Result Date: 05/23/2021 CLINICAL DATA:  Delirium EXAM: MRI HEAD WITHOUT AND WITH CONTRAST TECHNIQUE: Multiplanar, multiecho pulse sequences of the brain and surrounding structures were obtained without and with intravenous contrast. CONTRAST:  8.72mL GADAVIST GADOBUTROL 1 MMOL/ML IV SOLN COMPARISON:  None. FINDINGS: Brain: There are 2 punctate foci of abnormal diffusion restriction within the left MCA territory. No other diffusion abnormality. No acute or chronic hemorrhage. There is multifocal hyperintense T2-weighted signal within the white matter. Generalized volume loss without a clear lobar predilection. The midline structures are normal. Vascular: Major flow voids are preserved. Skull and upper cervical spine: Normal calvarium and skull base. Visualized upper cervical spine and soft tissues are normal. Sinuses/Orbits:No paranasal sinus fluid levels or advanced mucosal thickening. No mastoid or middle ear effusion. Normal orbits. IMPRESSION: 1. Two punctate foci of acute ischemia within the left MCA territory. No hemorrhage or mass effect. 2. Findings of chronic microvascular ischemia and generalized volume loss. Electronically Signed   By: Ulyses Jarred M.D.    On: 05/23/2021 19:38   DG Pelvis Portable  Result Date: 05/19/2021 CLINICAL DATA:  Fall on blood thinners EXAM: PORTABLE PELVIS 1-2 VIEWS COMPARISON:  None. FINDINGS: Limited AP pelvis with rotated, foreshortened femoral necks and soft-tissue attenuation degrading detail. No evidence of fracture or dislocation. Lumbar spine degeneration. Bilateral  sacroiliac osteoarthritis. IMPRESSION: Limited portable pelvis without acute finding. Electronically Signed   By: Monte Fantasia M.D.   On: 05/19/2021 08:51   DG Pelvis Portable  Result Date: 05/07/2021 CLINICAL DATA:  Trauma. No additional history provided or available. EXAM: PORTABLE PELVIS 1-2 VIEWS COMPARISON:  None. FINDINGS: No evidence of pelvic fracture. Intertrochanteric left femur is not well assessed due to positioning. No evidence of pubic symphyseal or sacroiliac joint diastasis. Sclerosis about both sacroiliac joints appears chronic. Pubic rami are intact. Femoral heads are seated in the acetabulum. Soft tissue attenuation from habitus partially obscures detailed assessment. IMPRESSION: Technically limited exam.  No evidence of pelvic fracture. Electronically Signed   By: Keith Rake M.D.   On: 05/07/2021 17:49   DG Chest Port 1 View  Result Date: 05/24/2021 CLINICAL DATA:  Shortness of breath EXAM: PORTABLE CHEST 1 VIEW COMPARISON:  Chest CT 05/19/2021 FINDINGS: Cardiomegaly with congested appearance of vessels. No visible effusion or pneumothorax. Mitral annular calcification. Artifact from EKG leads. IMPRESSION: Cardiomegaly and vascular congestion. Electronically Signed   By: Monte Fantasia M.D.   On: 05/24/2021 04:08   DG Chest Portable 1 View  Result Date: 05/19/2021 CLINICAL DATA:  Fall on blood thinners EXAM: PORTABLE CHEST 1 VIEW COMPARISON:  05/07/2021 FINDINGS: Cardiopericardial enlargement with vascular congestion. No visible effusion or pneumothorax. Extensive artifact from EKG leads. No visible fracture IMPRESSION:  Cardiomegaly and vascular congestion. Electronically Signed   By: Monte Fantasia M.D.   On: 05/19/2021 08:49   DG Knee Complete 4 Views Right  Result Date: 05/19/2021 CLINICAL DATA:  Fall.  Pain. EXAM: RIGHT KNEE - COMPLETE 4+ VIEW COMPARISON:  No comparison studies available. FINDINGS: No fracture or dislocation. No worrisome lytic or sclerotic osseous abnormality. No evidence for joint effusion. Mild hypertrophic spurring noted all 3 compartments. IMPRESSION: Degenerative changes without acute bony abnormality. Electronically Signed   By: Misty Stanley M.D.   On: 05/19/2021 09:34   DG Foot Complete Right  Result Date: 05/19/2021 CLINICAL DATA:  Fall, pain, blood thinners EXAM: RIGHT FOOT COMPLETE - 3+ VIEW COMPARISON:  None. FINDINGS: No fracture or dislocation of the right foot. Joint spaces are well preserved. Vascular calcinosis. Diffuse soft tissue edema about the foot and ankle. IMPRESSION: No fracture or dislocation of the right foot. Diffuse soft tissue edema. Electronically Signed   By: Eddie Candle M.D.   On: 05/19/2021 09:34   DG Hip Unilat With Pelvis 2-3 Views Right  Result Date: 05/19/2021 CLINICAL DATA:  Fall.  Leg pain.  Hip pain. EXAM: DG HIP (WITH OR WITHOUT PELVIS) 2-3V RIGHT COMPARISON:  None. FINDINGS: Bones are diffusely demineralized. Although assessment is limited by positioning, there appears to be a nondisplaced fracture of the right femoral neck. No other acute fracture evident. IMPRESSION: Probable nondisplaced right femoral neck fracture. CT imaging could be used to further evaluate. Electronically Signed   By: Misty Stanley M.D.   On: 05/19/2021 09:33   CT CHEST ABDOMEN PELVIS WO CONTRAST  Result Date: 05/19/2021 CLINICAL DATA:  Chest trauma, mod-severe EXAM: CT CHEST, ABDOMEN AND PELVIS WITHOUT CONTRAST TECHNIQUE: Multidetector CT imaging of the chest, abdomen and pelvis was performed following the standard protocol without IV contrast. COMPARISON:  August 2022, July  2022 FINDINGS: CT CHEST Cardiovascular: The thoracic aorta is normal in caliber. Aortic atherosclerosis. The heart is enlarged. Trace pericardial effusion is stable. Coronary artery calcification. Aortic valve calcifications. Mediastinum/Nodes: No enlarged mediastinal, hilar, or axillary lymph nodes. The thyroid gland appears normal. Lungs/Pleura: Compressive  atelectasis in the left lung due to patient positioning. No pleural effusion. No pneumothorax. No mass or focal consolidation. No suspicious pulmonary nodules. CT ABDOMEN Hepatobiliary: The liver is normal in size. Again seen is at least one ill-defined hyperdense lesion in the superior aspect of the right hepatic lobe near the hepatic dome. No intrahepatic or extrahepatic biliary ductal dilation. The gallbladder appears normal. Spleen: Normal in size without focal abnormality. Pancreas: No pancreatic ductal dilatation or surrounding inflammatory changes. Adrenals/Urinary Tract: Adrenal glands are unremarkable. Atrophic appearance of the left kidney. The right kidney demonstrates a stable inferior pole cyst measuring simple fluid attenuation. No hydronephrosis. Bladder is unremarkable. Stomach/Bowel: The stomach, small bowel and large bowel are normal in caliber without abnormal wall thickening or surrounding inflammatory changes. Sigmoid diverticulosis without active inflammatory changes. Reproductive: Status post hysterectomy. No adnexal masses. Lymphatic: No enlarged lymph nodes in the abdomen or pelvis. Vasculature: Diffuse atherosclerotic calcifications. Other: No abdominopelvic ascites. 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 Musculoskeletal: Degenerative changes of the lumbar spine most advanced at L5-S1. No aggressive osseous lesions. Diffuse subcutaneous edema. IMPRESSION: Chest: 1. No acute traumatic findings in the chest. 2. Stable chronic findings described. Abdomen/pelvis: 1. No acute traumatic  findings in the abdomen or pelvis. 2. Stable appearance of hepatic lesions and peritoneal implants in the left hemiabdomen as described on recent previous cross-sectional exams. Correlate with history of malignancy and treatment. Electronically Signed   By: Albin Felling M.D.   On: 05/19/2021 11:29     Subjective: Patient seen examined at bedside, resting comfortably.  Somnolent.  Son present at bedside.  Discharging to residential hospice, [facility] this afternoon.  Patient's son very appreciative all the care given to his mother.  Discharge Exam: Vitals:   05/25/21 0752 05/26/21 0721  BP: (!) 101/44 119/71  Pulse: 86 75  Resp: (!) 24 19  Temp: 97.6 F (36.4 C) (!) 97.5 F (36.4 C)  SpO2: 97% 95%   Vitals:   05/24/21 1119 05/24/21 2018 05/25/21 0752 05/26/21 0721  BP: 122/90 97/69 (!) 101/44 119/71  Pulse: 81 86 86 75  Resp: (!) 36 (!) 30 (!) 24 19  Temp: (!) 97.5 F (36.4 C) 97.7 F (36.5 C) 97.6 F (36.4 C) (!) 97.5 F (36.4 C)  TempSrc: Oral Oral Oral Axillary  SpO2:  100% 97% 95%  Weight:      Height:        General: Somnolent, NAD Cardiovascular: RRR, S1/S2 +, no rubs, no gallops Respiratory: CTA bilaterally, no wheezing, no rhonchi, on nasal cannula Abdominal: Soft, NT, ND, bowel sounds + Extremities: no edema, no cyanosis    The results of significant diagnostics from this hospitalization (including imaging, microbiology, ancillary and laboratory) are listed below for reference.     Microbiology: Recent Results (from the past 240 hour(s))  SARS CORONAVIRUS 2 (TAT 6-24 HRS) Nasopharyngeal Nasopharyngeal Swab     Status: None   Collection Time: 05/23/21  1:08 AM   Specimen: Nasopharyngeal Swab  Result Value Ref Range Status   SARS Coronavirus 2 NEGATIVE NEGATIVE Final    Comment: (NOTE) SARS-CoV-2 target nucleic acids are NOT DETECTED.  The SARS-CoV-2 RNA is generally detectable in upper and lower respiratory specimens during the acute phase of  infection. Negative results do not preclude SARS-CoV-2 infection, do not rule out co-infections with other pathogens, and should not be used as the sole basis for treatment or other patient management decisions. Negative results must  be combined with clinical observations, patient history, and epidemiological information. The expected result is Negative.  Fact Sheet for Patients: SugarRoll.be  Fact Sheet for Healthcare Providers: https://www.woods-mathews.com/  This test is not yet approved or cleared by the [place] FDA and  has been authorized for detection and/or diagnosis of SARS-CoV-2 by FDA under an Emergency Use Authorization (EUA). This EUA will remain  in effect (meaning this test can be used) for the duration of the COVID-19 declaration under Se ction 564(b)(1) of the Act, 21 U.S.C. section 360bbb-3(b)(1), unless the authorization is terminated or revoked sooner.  Performed at [facility], [place] [address], [place], [place] [address]   Urine Culture     Status: Abnormal   Collection Time: 05/23/21  7:04 AM   Specimen: Urine, Clean Catch  Result Value Ref Range Status   Specimen Description URINE, CLEAN CATCH  Final   Special Requests   Final    NONE Performed at [facility], [place] [address] [place], [place] [address]    Culture >=100,000 COLONIES/mL ESCHERICHIA COLI (A)  Final   Report Status 05/26/2021 FINAL  Final   Organism ID, Bacteria ESCHERICHIA COLI (A)  Final      Susceptibility   Escherichia coli - MIC*    AMPICILLIN >=32 RESISTANT Resistant     CEFAZOLIN 16 SENSITIVE Sensitive     CEFEPIME <=0.12 SENSITIVE Sensitive     CEFTRIAXONE <=0.25 SENSITIVE Sensitive     CIPROFLOXACIN <=0.25 SENSITIVE Sensitive     GENTAMICIN <=1 SENSITIVE Sensitive     IMIPENEM <=0.25 SENSITIVE Sensitive     NITROFURANTOIN <=16 SENSITIVE Sensitive     TRIMETH/SULFA <=20 SENSITIVE Sensitive     AMPICILLIN/SULBACTAM  >=32 RESISTANT Resistant     PIP/TAZO <=4 SENSITIVE Sensitive     * >=100,000 COLONIES/mL ESCHERICHIA COLI  Culture, blood (routine x 2)     Status: None (Preliminary result)   Collection Time: 05/23/21  9:05 AM   Specimen: BLOOD  Result Value Ref Range Status   Specimen Description BLOOD RIGHT ANTECUBITAL  Final   Special Requests AEROBIC BOTTLE ONLY Blood Culture adequate volume  Final   Culture   Final    NO GROWTH 3 DAYS Performed at [facility], [place] [address], [place], [place] [address]    Report Status PENDING  Incomplete  Culture, blood (routine x 2)     Status: None (Preliminary result)   Collection Time: 05/23/21  9:05 AM   Specimen: BLOOD  Result Value Ref Range Status   Specimen Description BLOOD BLOOD RIGHT HAND  Final   Special Requests AEROBIC BOTTLE ONLY Blood Culture adequate volume  Final   Culture   Final    NO GROWTH 3 DAYS Performed at [facility], [place] [address], [place], [place] [address]    Report Status PENDING  Incomplete  MRSA Next Gen by PCR, Nasal     Status: None   Collection Time: 05/24/21  2:05 AM   Specimen: Nasal Mucosa; Nasal Swab  Result Value Ref Range Status   MRSA by PCR Next Gen NOT DETECTED NOT DETECTED Final    Comment: (NOTE) The GeneXpert MRSA Assay (FDA approved for NASAL specimens only), is one component of a comprehensive MRSA colonization surveillance program. It is not intended to diagnose MRSA infection nor to guide or monitor treatment for MRSA infections. Test performance is not FDA approved in patients less than 73 years old. Performed at [facility], [address] [place], [place] [address]      Labs: BNP (last 3 results) Recent Labs    03/04/21 1553  BNP 3,976.7*   Basic Metabolic Panel: Recent Labs  Lab 05/22/21 1552 05/23/21 0111  NA 144 142  K 4.0 3.9  CL 98 99  CO2 30 28  GLUCOSE 117* 155*  BUN 68* 74*  CREATININE 5.19* 5.29*  CALCIUM 10.3 10.4*  PHOS  --  5.0*   Liver  Function Tests: Recent Labs  Lab 05/22/21 1552 05/23/21 0111  AST 25  --   ALT 21  --   ALKPHOS 80  --   BILITOT 1.1  --   PROT 6.8  --   ALBUMIN 3.2* 3.1*   Recent Labs  Lab 05/22/21 1947  LIPASE 84*   Recent Labs  Lab 05/22/21 1947  AMMONIA 12   CBC: Recent Labs  Lab 05/22/21 1552  WBC 4.1  HGB 11.2*  HCT 35.6*  MCV 103.2*  PLT 148*   Cardiac Enzymes: No results for input(s): CKTOTAL, CKMB, CKMBINDEX, TROPONINI in the last 168 hours. BNP: Invalid input(s): POCBNP CBG: Recent Labs  Lab 05/22/21 2217 05/24/21 1121  GLUCAP 103* 111*   D-Dimer No results for input(s): DDIMER in the last 72 hours. Hgb A1c Recent Labs    05/24/21 0714  HGBA1C 5.5   Lipid Profile Recent Labs    05/24/21 0714  CHOL 112  HDL 27*  LDLCALC 65  TRIG 102  CHOLHDL 4.1   Thyroid function studies No results for input(s): TSH, T4TOTAL, T3FREE, THYROIDAB in the last 72 hours.  Invalid input(s): FREET3 Anemia work up No results for input(s): VITAMINB12, FOLATE, FERRITIN, TIBC, IRON, RETICCTPCT in the last 72 hours. Urinalysis    Component Value Date/Time   COLORURINE YELLOW 05/22/2021 1552   APPEARANCEUR TURBID (A) 05/22/2021 1552   LABSPEC 1.014 05/22/2021 1552   PHURINE 6.0 05/22/2021 1552   GLUCOSEU NEGATIVE 05/22/2021 1552   HGBUR MODERATE (A) 05/22/2021 1552   BILIRUBINUR NEGATIVE 05/22/2021 1552   KETONESUR NEGATIVE 05/22/2021 1552   PROTEINUR 100 (A) 05/22/2021 1552   NITRITE NEGATIVE 05/22/2021 1552   LEUKOCYTESUR MODERATE (A) 05/22/2021 1552   Sepsis Labs Invalid input(s): PROCALCITONIN,  WBC,  LACTICIDVEN Microbiology Recent Results (from the past 240 hour(s))  SARS CORONAVIRUS 2 (TAT 6-24 HRS) Nasopharyngeal Nasopharyngeal Swab     Status: None   Collection Time: 05/23/21  1:08 AM   Specimen: Nasopharyngeal Swab  Result Value Ref Range Status   SARS Coronavirus 2 NEGATIVE NEGATIVE Final    Comment: (NOTE) SARS-CoV-2 target nucleic acids are NOT  DETECTED.  The SARS-CoV-2 RNA is generally detectable in upper and lower respiratory specimens during the acute phase of infection. Negative results do not preclude SARS-CoV-2 infection, do not rule out co-infections with other pathogens, and should not be used as the sole basis for treatment or other patient management decisions. Negative results must be combined with clinical observations, patient history, and epidemiological information. The expected result is Negative.  Fact Sheet for Patients: SugarRoll.be  Fact Sheet for Healthcare Providers: https://www.woods-mathews.com/  This test is not yet approved or cleared by the [place] FDA and  has been authorized for detection and/or diagnosis of SARS-CoV-2 by FDA under an Emergency Use Authorization (EUA). This EUA will remain  in effect (meaning this test can be used) for the duration of the COVID-19 declaration under Se ction 564(b)(1) of the Act, 21 U.S.C. section 360bbb-3(b)(1), unless the authorization is terminated or revoked  sooner.  Performed at [facility], [place] [address], [place], [place] [address]   Urine Culture     Status: Abnormal   Collection Time: 05/23/21  7:04 AM   Specimen: Urine, Clean Catch  Result Value Ref Range Status   Specimen Description URINE, CLEAN CATCH  Final   Special Requests   Final    NONE Performed at [facility], [place] [address], [place], [place] [address]    Culture >=100,000 COLONIES/mL ESCHERICHIA COLI (A)  Final   Report Status 05/26/2021 FINAL  Final   Organism ID, Bacteria ESCHERICHIA COLI (A)  Final      Susceptibility   Escherichia coli - MIC*    AMPICILLIN >=32 RESISTANT Resistant     CEFAZOLIN 16 SENSITIVE Sensitive     CEFEPIME <=0.12 SENSITIVE Sensitive     CEFTRIAXONE <=0.25 SENSITIVE Sensitive     CIPROFLOXACIN <=0.25 SENSITIVE Sensitive     GENTAMICIN <=1 SENSITIVE Sensitive     IMIPENEM <=0.25 SENSITIVE  Sensitive     NITROFURANTOIN <=16 SENSITIVE Sensitive     TRIMETH/SULFA <=20 SENSITIVE Sensitive     AMPICILLIN/SULBACTAM >=32 RESISTANT Resistant     PIP/TAZO <=4 SENSITIVE Sensitive     * >=100,000 COLONIES/mL ESCHERICHIA COLI  Culture, blood (routine x 2)     Status: None (Preliminary result)   Collection Time: 05/23/21  9:05 AM   Specimen: BLOOD  Result Value Ref Range Status   Specimen Description BLOOD RIGHT ANTECUBITAL  Final   Special Requests AEROBIC BOTTLE ONLY Blood Culture adequate volume  Final   Culture   Final    NO GROWTH 3 DAYS Performed at [facility], [place] [address] [place], [place] [address]    Report Status PENDING  Incomplete  Culture, blood (routine x 2)     Status: None (Preliminary result)   Collection Time: 05/23/21  9:05 AM   Specimen: BLOOD  Result Value Ref Range Status   Specimen Description BLOOD BLOOD RIGHT HAND  Final   Special Requests AEROBIC BOTTLE ONLY Blood Culture adequate volume  Final   Culture   Final    NO GROWTH 3 DAYS Performed at [facility], [place] [address], [place], [place] [address]    Report Status PENDING  Incomplete  MRSA Next Gen by PCR, Nasal     Status: None   Collection Time: 05/24/21  2:05 AM   Specimen: Nasal Mucosa; Nasal Swab  Result Value Ref Range Status   MRSA by PCR Next Gen NOT DETECTED NOT DETECTED Final    Comment: (NOTE) The GeneXpert MRSA Assay (FDA approved for NASAL specimens only), is one component of a comprehensive MRSA colonization surveillance program. It is not intended to diagnose MRSA infection nor to guide or monitor treatment for MRSA infections. Test performance is not FDA approved in patients less than 38 years old. Performed at [facility], [place] [address], [place], [place] [address]      Time coordinating discharge: Over 30 minutes  SIGNED:   Alayzia Pavlock J [place] DO  [facility] 05/26/2021, 10:07 AM

## 2021-05-26 NOTE — Progress Notes (Signed)
Called report to RN Butch Penny at [facility]. All questions answered.  Will attempt to call when [facility] arrives to pick up patient. Per secure chat will call son [name] when [facility] transports patient to [facility].

## 2021-05-26 NOTE — Progress Notes (Signed)
[facility] ([facility]) Hospital Liaison note.     This patient is approved to transfer to [facility] today.    [facility] will notify TOC when registration paperwork has been completed to arrange transport.    RN please call report to [phone].   Thank you,     Mary Anne Robertson, RN, CCM       [facility] Hospital Liaison  [phone] 

## 2021-05-27 NOTE — Progress Notes (Signed)
Patient admitted to hospice/palliative care

## 2021-05-28 LAB — CULTURE, BLOOD (ROUTINE X 2)
Culture: NO GROWTH
Culture: NO GROWTH
Special Requests: ADEQUATE
Special Requests: ADEQUATE

## 2021-06-05 ENCOUNTER — Other Ambulatory Visit (HOSPITAL_COMMUNITY): Payer: Self-pay

## 2021-06-08 ENCOUNTER — Inpatient Hospital Stay: Payer: Medicare Other

## 2021-06-08 ENCOUNTER — Inpatient Hospital Stay: Payer: Medicare Other | Admitting: Oncology

## 2021-06-11 ENCOUNTER — Telehealth: Payer: Self-pay | Admitting: Nurse Practitioner

## 2021-06-11 NOTE — Telephone Encounter (Signed)
Called patient to schedule AWV but her voice mail box was full> I was not able to LVM. Please schedule this appt if pt calls the office.  Thanks

## 2021-07-21 DEATH — deceased

## 2021-11-02 ENCOUNTER — Telehealth: Payer: Self-pay | Admitting: Physician Assistant

## 2021-11-02 NOTE — Telephone Encounter (Signed)
°  [facility]   MULTIDISCIPLINARY HEART VALVE TEAM   Called patient's son who informed me that she passed the second week of October at [facility]. Will mark her as deceased in [place].  Angelena Form PA-C  [facility]

## 2023-01-07 ENCOUNTER — Other Ambulatory Visit (HOSPITAL_BASED_OUTPATIENT_CLINIC_OR_DEPARTMENT_OTHER): Payer: Self-pay

## 2023-04-06 IMAGING — DX DG CHEST 1V PORT
1 series · 1 of 1 positions shown · non-contrast
Comparison: None.

CLINICAL DATA: 70-year-old female with shortness of breath.

EXAM:
PORTABLE CHEST 1 VIEW

[chest ap]
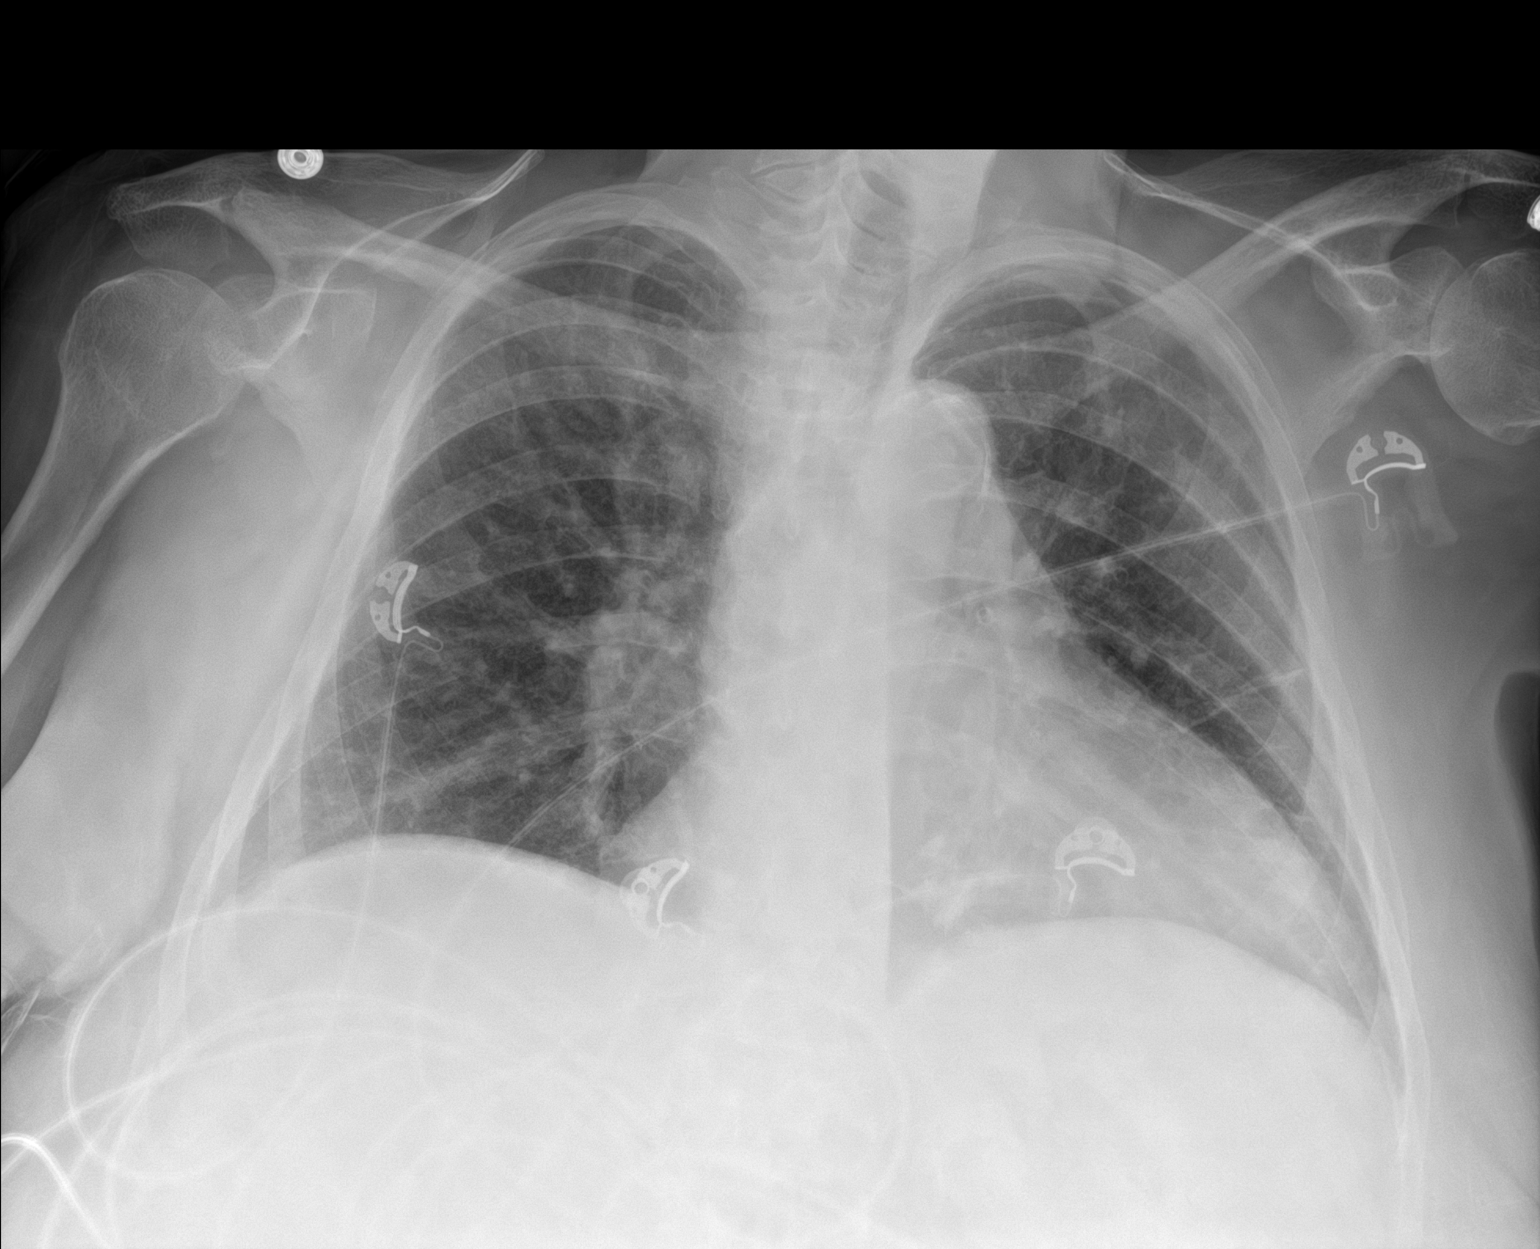

[1 of 1 positions shown; findings below may reference images not displayed]

FINDINGS: There is mild cardiomegaly with mild central vascular congestion. No
focal consolidation, pleural effusion, or pneumothorax.
Atherosclerotic calcification of the aortic arch. No acute osseous
pathology.
IMPRESSION: Mild cardiomegaly with mild central vascular congestion. No focal
consolidation.
